# Patient Record
Sex: Male | Born: 1935 | Race: White | Hispanic: No | Marital: Married | Smoking: Former smoker
Health system: Southern US, Community
[De-identification: ages and names within clinical notes are randomized; demographics above are authoritative.]

## PROBLEM LIST (undated history)

## (undated) DIAGNOSIS — G250 Essential tremor: Principal | ICD-10-CM

## (undated) DIAGNOSIS — I10 Essential (primary) hypertension: Secondary | ICD-10-CM

## (undated) HISTORY — DX: Essential tremor: G25.0

## (undated) HISTORY — PX: TUMOR REMOVAL: SHX12

---

## 2000-11-29 ENCOUNTER — Ambulatory Visit (HOSPITAL_COMMUNITY): Admission: RE | Admit: 2000-11-29 | Discharge: 2000-11-29 | Payer: Self-pay | Admitting: *Deleted

## 2006-05-24 ENCOUNTER — Ambulatory Visit (HOSPITAL_BASED_OUTPATIENT_CLINIC_OR_DEPARTMENT_OTHER): Admission: RE | Admit: 2006-05-24 | Discharge: 2006-05-25 | Payer: Self-pay | Admitting: Otolaryngology

## 2006-05-24 ENCOUNTER — Encounter (INDEPENDENT_AMBULATORY_CARE_PROVIDER_SITE_OTHER): Payer: Self-pay | Admitting: Specialist

## 2006-11-14 ENCOUNTER — Emergency Department (HOSPITAL_COMMUNITY): Admission: EM | Admit: 2006-11-14 | Discharge: 2006-11-14 | Payer: Self-pay | Admitting: Emergency Medicine

## 2006-12-19 ENCOUNTER — Encounter (INDEPENDENT_AMBULATORY_CARE_PROVIDER_SITE_OTHER): Payer: Self-pay | Admitting: Specialist

## 2006-12-19 ENCOUNTER — Ambulatory Visit (HOSPITAL_COMMUNITY): Admission: RE | Admit: 2006-12-19 | Discharge: 2006-12-19 | Payer: Self-pay | Admitting: *Deleted

## 2007-05-08 ENCOUNTER — Observation Stay (HOSPITAL_COMMUNITY): Admission: EM | Admit: 2007-05-08 | Discharge: 2007-05-09 | Payer: Self-pay | Admitting: Emergency Medicine

## 2008-09-09 ENCOUNTER — Ambulatory Visit: Payer: Self-pay | Admitting: Oncology

## 2011-03-09 NOTE — H&P (Signed)
NAME:  Andres Lopez, Andres Lopez NO.:  [id number]   MEDICAL RECORD NO.:  [id number]          PATIENT TYPE:  EMS   LOCATION:  MAJO                         FACILITY:  [facility]   PHYSICIAN:  Ladell Pier, M.D.   DATE OF BIRTH:  May 30, 1936   DATE OF ADMISSION:  05/07/2007  DATE OF DISCHARGE:                              HISTORY & PHYSICAL   PRIMARY CARE PHYSICIAN:  Dr. Merri Brunette.   CHIEF COMPLAINT:  Nausea, vomiting and diarrhea.   HISTORY OF PRESENT ILLNESS:  Patient is a 75 year old white male, past  medical history significant for hypertension and dyslipidemia.  Patient  stated that after eating a salad last night with tomatoes, approximately  1 hour after that, he developed nausea, vomiting, diarrhea and abdominal  pain.  He also had low grade fevers.  He has had this similar problem in  the past.  In February, he had both upper and lower endoscopy done for  guaiac positive stools by Dr. Virginia Rochester that were both normal, except for  diverticulosis.   PAST MEDICAL HISTORY:  Significant for:  1. Hypertension.  2. Dyslipidemia.  3. 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  4. History of diverticulosis seen on colonoscopy.   FAMILY HISTORY:  Noncontributory.   SOCIAL HISTORY:  He is married.  He has 1 child.  He lives in  [place].  He does not smoke or drink alcohol.  He is employed with  [employer].  He is a Re[employer] [place].   MEDICATIONS:  He takes:  1. Calan 340 mg daily.  2. Lipitor 20 mg q.h.s.  3. Aspirin 81 mg daily.  4. __________ 5 mg daily.   ALLERGIES:  NONE.   REVIEW OF SYSTEMS:  As per stated in the HPI.   PHYSICAL EXAMINATION:  VITAL SIGNS:  Temperature 99.9.  Blood pressure  146/77.  Pulse of 82.  Respirations 20.  Pulse oximetry 97% on room air.  HEENT:  Head is normocephalic, atraumatic.  Pupils equal and reactive to  light without erythema.  CARDIOVASCULAR:  Regular rate and rhythm.  LUNGS:  Clear bilaterally.  ABDOMEN:   Soft, diffusely tender, positive bowel sounds.  EXTREMITIES:  Without edema.   LABORATORY DATA:  CAT scan of the abdomen shows enteritis.  Sodium 139,  potassium 3.6, chloride 106, CO2 25.9, BUN 12, creatinine 0.9, glucose  99.  WBC 20.1, hemoglobin 13.1, platelets 292, MCV 86.8, lipase 16, AST  19, ALT 18.  UA negative.   ASSESSMENT/PLAN:  1. Acute gastroenteritis.  We will admit him to the hospital,      observation, give him IV fluids, check stool cultures.  We will      also start him on Cipro 250 mg b.i.d.  If he remains stable without      any worsening any of his symptoms, then we will discharge home.  2. Hypertension.  We will continue him on his home medication.  3. Dyslipidemia.  We will also continue him on his Lipitor.      Ladell Pier, M.D.  Electronically Signed     NJ/MEDQ  D:  05/08/2007  T:  05/08/2007  Job:  [id number]

## 2011-03-09 NOTE — Discharge Summary (Signed)
NAME:  Andres Lopez, Andres Lopez NO.:  [id number]   MEDICAL RECORD NO.:  [id number]          PATIENT TYPE:  INP   LOCATION:  5531                         FACILITY:  [facility]   PHYSICIAN:  Ladell Pier, M.D.   DATE OF BIRTH:  14-Aug-1936   DATE OF ADMISSION:  05/07/2007  DATE OF DISCHARGE:  05/09/2007                               DISCHARGE SUMMARY   DISCHARGE DIAGNOSES:  1. Acute gastroenteritis with nausea, vomiting, and diarrhea.  2. Hypertension.  3. Dyslipidemia.  4. History of diverticulosis seen on colonoscopy.  5. Benign tumor removed from the neck recently.   DISCHARGE MEDICATIONS:  1. Cipro 250 mg b.i.d.  2. Kaolin x3 days.  3. Kaolin 240 mg daily.  4. Lipitor 20 mg q.h.s.  5. Aspirin 81 mg daily.  6. Altace 5 mg daily.   FOLLOW UP APPOINTMENTS:  The patient is to follow up with primary care  physician Dr. Renne Crigler.   CONSULTANTS:  None.   PROCEDURES:  None.   HISTORY OF PRESENT ILLNESS:  The patient is a 76 year old white male  with a past medical history significant for hypertension, dyslipidemia.  The patient stated that after eating a salad last night with tomatoes,  that approximately 1 hour after that he became ill with nausea,  vomiting, and diarrhea and abdominal pain.  He had low-grade fevers.  Past medical history, family and social history, meds, allergies, review  of systems, per the admission H and P.   PHYSICAL EXAMINATION ON DISCHARGE:  VITAL SIGNS:  Temperature 98.8,  pulse 66, respirations 20, blood pressure 160/83, pulse ox 93% on room  air.  HEENT:  Normocephalic, atraumatic.  Pupils reactive to light.  Throat  without erythema.  CARDIOVASCULAR:  Regular rate and rhythm.  LUNGS:  Clear bilaterally.  ABDOMEN:  Soft, nontender, nondistended.  Positive bowel sounds.  EXTREMITIES:  Without edema.   HOSPITAL COURSE:  1. Acute gastroenteritis with nausea, vomiting, and diarrhea:  The      patient was admitted to the hospital and given IV  fluids, Phenergan      p.r.n. for nausea, and started on Cipro.  The second day of      admission, he felt great.  No more abdominal pain, no episodes of      nausea or vomiting.  He was discharged on Cipro for 3 days and told      to follow up with PCP.  He was given Cipro because his stool      culture was positive for fecal lactoferrin, which is increased      white blood cells.  2. Hypertension:  He was continued on his Altace while he was in the      hospital.  His blood pressure was mildly elevated probably      secondary to the fluid he was receiving from IV fluids.  He will go      back to his normal blood pressure regimen on discharge.  3. Dyslipidemia:  He will be discharged on his Lipitor.   DISCHARGE LABS:  Fecal lactoferrin positive.  BMP:  Sodium 136,  potassium 4, chloride  104, CO2 24, glucose 87, BUN 11, creatinine 0.8.  WBC 13, hemoglobin 12.2, platelets 262,000.  CT of the abdomen and  pelvis mild dilated proximal to marginal small bowel loop but no  evidence of obstruction, some question of mild inflammatory enteritis,  large duodenal diverticulum, the sigmoid colon and prostate gland  enlarged.      Ladell Pier, M.D.  Electronically Signed     NJ/MEDQ  D:  05/09/2007  T:  05/10/2007  Job:  [id number]   cc:   Soyla Murphy. Renne Crigler, M.D.

## 2011-03-12 NOTE — Procedures (Signed)
[facility]  Patient:    Andres Lopez, Andres Lopez                      MRN: [id number] Proc. Date: 11/29/00 Adm. Date:  [id number] Attending:  Sabino Gasser                           Procedure Report  PROCEDURE PERFORMED:  Colonoscopy.  ENDOSCOPIST:  Sabino Gasser, M.D.  INDICATIONS FOR PROCEDURE:  Rectal bleeding.  ANESTHESIA:  Demerol 90 mg, Versed 9 mg.  DESCRIPTION OF PROCEDURE:  With the patient mildly sedated in the left lateral decubitus position, the Olympus videoscopic colonoscope was inserted in the rectum after normal rectal examination was performed and passed under direct vision into the cecum.  The cecum was identified by the ileocecal valve and appendiceal orifice, both of which were photographed.  From this point, the colonoscope was slowly withdrawn, taking circumferential views of the entire colonic mucosa stopping in the sigmoid colon area where there was significant diverticulosis seen and some tortuosity.  Photograph was taken.  The endoscope was pulled to the rectum, which appeared normal on direct and retroflex view as well and pulled through the anal canal.  Patients vital signs and pulse oximeter remained stable.  The patient tolerated the procedure well and without apparent complications.  FINDINGS:  Diverticulosis of the sigmoid colon. Otherwise unremarkable colonoscopic examination to the cecum.  PLAN:  Repeat examination in five years. DD:  11/29/00 TD:  11/30/00 Job: [id number]

## 2011-03-12 NOTE — Op Note (Signed)
NAME:  Andres Lopez, BENTLER NO.:  [id number]   MEDICAL RECORD NO.:  [id number]          PATIENT TYPE:  AMB   LOCATION:  ENDO                         FACILITY:  [facility]   PHYSICIAN:  Georgiana Spinner, M.D.    DATE OF BIRTH:  05-12-1936   DATE OF PROCEDURE:  12/19/2006  DATE OF DISCHARGE:                               OPERATIVE REPORT   PROCEDURE:  Colonoscopy.   INDICATIONS:  Hemoccult positivity.   ANESTHESIA:  Fentanyl 25 mcg, Versed 2 mg.   PROCEDURE:  With the patient mildly sedated in the left lateral  decubitus position, a rectal examination was performed which was  unremarkable to my exam.  Subsequently, the Pentax videoscopic  colonoscope was inserted into the rectum and passed under direct vision  to the cecum identified by ileocecal valve and appendiceal orifice both  of which were photographed.  From this point, the colonoscope was slowly  withdrawn, taking circumferential views of colonic mucosa, stopping to  photograph diverticulosis noted in the sigmoid colon until we reached  the rectum which appeared normal on direct and showed hemorrhoids on  retroflexed view.  The endoscope was straightened and withdrawn.  The  patient's vital signs and pulse oximeter remained stable.  The patient  tolerated the procedure well without apparent complications.   FINDINGS:  Diverticulosis of sigmoid colon, internal hemorrhoids,  otherwise unremarkable examination.   PLAN:  See endoscopy note for further details.           ______________________________  Georgiana Spinner, M.D.     GMO/MEDQ  D:  12/19/2006  T:  12/19/2006  Job:  [id number]

## 2011-08-08 ENCOUNTER — Emergency Department (HOSPITAL_COMMUNITY)
Admission: EM | Admit: 2011-08-08 | Discharge: 2011-08-09 | Disposition: A | Discharge status: Home / Self Care | Payer: No Typology Code available for payment source | Attending: Emergency Medicine | Admitting: Emergency Medicine

## 2011-08-08 ENCOUNTER — Emergency Department (HOSPITAL_COMMUNITY): Payer: No Typology Code available for payment source

## 2011-08-08 DIAGNOSIS — M25569 Pain in unspecified knee: Secondary | ICD-10-CM | POA: Insufficient documentation

## 2011-08-08 DIAGNOSIS — S99929A Unspecified injury of unspecified foot, initial encounter: Secondary | ICD-10-CM | POA: Insufficient documentation

## 2011-08-08 DIAGNOSIS — S301XXA Contusion of abdominal wall, initial encounter: Secondary | ICD-10-CM | POA: Insufficient documentation

## 2011-08-08 DIAGNOSIS — S8990XA Unspecified injury of unspecified lower leg, initial encounter: Secondary | ICD-10-CM | POA: Insufficient documentation

## 2011-08-08 DIAGNOSIS — Z7982 Long term (current) use of aspirin: Secondary | ICD-10-CM | POA: Insufficient documentation

## 2011-08-08 DIAGNOSIS — R109 Unspecified abdominal pain: Secondary | ICD-10-CM | POA: Insufficient documentation

## 2011-08-08 DIAGNOSIS — Z79899 Other long term (current) drug therapy: Secondary | ICD-10-CM | POA: Insufficient documentation

## 2011-08-08 DIAGNOSIS — M25469 Effusion, unspecified knee: Secondary | ICD-10-CM | POA: Insufficient documentation

## 2011-08-08 DIAGNOSIS — IMO0002 Reserved for concepts with insufficient information to code with codable children: Secondary | ICD-10-CM | POA: Insufficient documentation

## 2011-08-08 DIAGNOSIS — S51009A Unspecified open wound of unspecified elbow, initial encounter: Secondary | ICD-10-CM | POA: Insufficient documentation

## 2011-08-08 DIAGNOSIS — I10 Essential (primary) hypertension: Secondary | ICD-10-CM | POA: Insufficient documentation

## 2011-08-08 DIAGNOSIS — M25559 Pain in unspecified hip: Secondary | ICD-10-CM | POA: Insufficient documentation

## 2011-08-09 LAB — BASIC METABOLIC PANEL
CO2: 24
Calcium: 8.4
Chloride: 104
GFR calc Af Amer: >60
Glucose, Bld: 87
Potassium: 4
Sodium: 136

## 2011-08-09 LAB — CBC
HCT: 35.8 — ABNORMAL LOW
Hemoglobin: 12.2 — ABNORMAL LOW
MCHC: 34.2
MCV: 87.2
RBC: 4.1 — ABNORMAL LOW

## 2011-08-09 LAB — CLOSTRIDIUM DIFFICILE EIA

## 2011-08-10 LAB — GIARDIA/CRYPTOSPORIDIUM SCREEN(EIA)
Cryptosporidium Screen (EIA): NEGATIVE
Giardia Screen - EIA: NEGATIVE

## 2011-08-10 LAB — URINALYSIS, ROUTINE W REFLEX MICROSCOPIC
Glucose, UA: NEGATIVE
Protein, ur: NEGATIVE
Specific Gravity, Urine: 1.026

## 2011-08-10 LAB — FECAL LACTOFERRIN, QUANT

## 2011-08-10 LAB — CLOSTRIDIUM DIFFICILE EIA

## 2011-08-10 LAB — CBC
HCT: 39.3
Platelets: 292
RBC: 4.53
WBC: 20.1 — ABNORMAL HIGH

## 2011-08-10 LAB — I-STAT 8, (EC8 V) (CONVERTED LAB)
BUN: 12
Chloride: 106
HCT: 42
Hemoglobin: 14.3
Operator id: 277751
Potassium: 3.6
Sodium: 139

## 2011-08-10 LAB — DIFFERENTIAL
Eosinophils Relative: 0
Lymphocytes Relative: 7 — ABNORMAL LOW
Lymphs Abs: 1.5
Neutrophils Relative %: 86 — ABNORMAL HIGH

## 2011-08-10 LAB — HEPATIC FUNCTION PANEL
ALT: 18
Albumin: 3.6
Alkaline Phosphatase: 93
Total Protein: 6.8

## 2011-11-08 DIAGNOSIS — N138 Other obstructive and reflux uropathy: Secondary | ICD-10-CM | POA: Diagnosis not present

## 2011-11-08 DIAGNOSIS — N401 Enlarged prostate with lower urinary tract symptoms: Secondary | ICD-10-CM | POA: Diagnosis not present

## 2011-11-08 DIAGNOSIS — R972 Elevated prostate specific antigen [PSA]: Secondary | ICD-10-CM | POA: Diagnosis not present

## 2011-11-10 DIAGNOSIS — R0602 Shortness of breath: Secondary | ICD-10-CM | POA: Diagnosis not present

## 2011-11-12 DIAGNOSIS — Z79899 Other long term (current) drug therapy: Secondary | ICD-10-CM | POA: Diagnosis not present

## 2011-11-12 DIAGNOSIS — E7800 Pure hypercholesterolemia, unspecified: Secondary | ICD-10-CM | POA: Diagnosis not present

## 2011-11-15 DIAGNOSIS — M76899 Other specified enthesopathies of unspecified lower limb, excluding foot: Secondary | ICD-10-CM | POA: Diagnosis not present

## 2011-11-15 DIAGNOSIS — N401 Enlarged prostate with lower urinary tract symptoms: Secondary | ICD-10-CM | POA: Diagnosis not present

## 2011-11-15 DIAGNOSIS — R972 Elevated prostate specific antigen [PSA]: Secondary | ICD-10-CM | POA: Diagnosis not present

## 2011-12-01 DIAGNOSIS — G252 Other specified forms of tremor: Secondary | ICD-10-CM | POA: Diagnosis not present

## 2011-12-01 DIAGNOSIS — G250 Essential tremor: Secondary | ICD-10-CM | POA: Diagnosis not present

## 2012-02-28 DIAGNOSIS — G250 Essential tremor: Secondary | ICD-10-CM | POA: Diagnosis not present

## 2012-02-28 DIAGNOSIS — G252 Other specified forms of tremor: Secondary | ICD-10-CM | POA: Diagnosis not present

## 2012-03-29 DIAGNOSIS — G250 Essential tremor: Secondary | ICD-10-CM | POA: Diagnosis not present

## 2012-03-29 DIAGNOSIS — G252 Other specified forms of tremor: Secondary | ICD-10-CM | POA: Diagnosis not present

## 2012-04-20 DIAGNOSIS — E7800 Pure hypercholesterolemia, unspecified: Secondary | ICD-10-CM | POA: Diagnosis not present

## 2012-04-20 DIAGNOSIS — Z79899 Other long term (current) drug therapy: Secondary | ICD-10-CM | POA: Diagnosis not present

## 2012-04-25 DIAGNOSIS — E7800 Pure hypercholesterolemia, unspecified: Secondary | ICD-10-CM | POA: Diagnosis not present

## 2012-04-25 DIAGNOSIS — Z79899 Other long term (current) drug therapy: Secondary | ICD-10-CM | POA: Diagnosis not present

## 2012-04-25 DIAGNOSIS — I10 Essential (primary) hypertension: Secondary | ICD-10-CM | POA: Diagnosis not present

## 2012-05-05 DIAGNOSIS — Z0000 Encounter for general adult medical examination without abnormal findings: Secondary | ICD-10-CM | POA: Diagnosis not present

## 2012-05-05 DIAGNOSIS — N401 Enlarged prostate with lower urinary tract symptoms: Secondary | ICD-10-CM | POA: Diagnosis not present

## 2012-05-12 DIAGNOSIS — R972 Elevated prostate specific antigen [PSA]: Secondary | ICD-10-CM | POA: Diagnosis not present

## 2012-05-12 DIAGNOSIS — N401 Enlarged prostate with lower urinary tract symptoms: Secondary | ICD-10-CM | POA: Diagnosis not present

## 2012-07-06 DIAGNOSIS — M25559 Pain in unspecified hip: Secondary | ICD-10-CM | POA: Diagnosis not present

## 2012-07-06 DIAGNOSIS — M76899 Other specified enthesopathies of unspecified lower limb, excluding foot: Secondary | ICD-10-CM | POA: Diagnosis not present

## 2012-08-02 DIAGNOSIS — Z23 Encounter for immunization: Secondary | ICD-10-CM | POA: Diagnosis not present

## 2012-09-29 DIAGNOSIS — G250 Essential tremor: Secondary | ICD-10-CM | POA: Diagnosis not present

## 2012-09-29 DIAGNOSIS — G252 Other specified forms of tremor: Secondary | ICD-10-CM | POA: Diagnosis not present

## 2012-10-16 DIAGNOSIS — E7800 Pure hypercholesterolemia, unspecified: Secondary | ICD-10-CM | POA: Diagnosis not present

## 2012-10-16 DIAGNOSIS — I10 Essential (primary) hypertension: Secondary | ICD-10-CM | POA: Diagnosis not present

## 2012-10-16 DIAGNOSIS — Z0000 Encounter for general adult medical examination without abnormal findings: Secondary | ICD-10-CM | POA: Diagnosis not present

## 2012-10-16 DIAGNOSIS — Z79899 Other long term (current) drug therapy: Secondary | ICD-10-CM | POA: Diagnosis not present

## 2012-10-23 DIAGNOSIS — N400 Enlarged prostate without lower urinary tract symptoms: Secondary | ICD-10-CM | POA: Diagnosis not present

## 2012-10-23 DIAGNOSIS — I10 Essential (primary) hypertension: Secondary | ICD-10-CM | POA: Diagnosis not present

## 2012-10-23 DIAGNOSIS — R259 Unspecified abnormal involuntary movements: Secondary | ICD-10-CM | POA: Diagnosis not present

## 2012-10-23 DIAGNOSIS — E7800 Pure hypercholesterolemia, unspecified: Secondary | ICD-10-CM | POA: Diagnosis not present

## 2012-10-31 DIAGNOSIS — D638 Anemia in other chronic diseases classified elsewhere: Secondary | ICD-10-CM | POA: Diagnosis not present

## 2012-11-09 DIAGNOSIS — D500 Iron deficiency anemia secondary to blood loss (chronic): Secondary | ICD-10-CM | POA: Diagnosis not present

## 2013-01-29 ENCOUNTER — Other Ambulatory Visit: Payer: Self-pay | Admitting: Gastroenterology

## 2013-01-29 DIAGNOSIS — D1330 Benign neoplasm of unspecified part of small intestine: Secondary | ICD-10-CM | POA: Diagnosis not present

## 2013-01-29 DIAGNOSIS — D126 Benign neoplasm of colon, unspecified: Secondary | ICD-10-CM | POA: Diagnosis not present

## 2013-01-29 DIAGNOSIS — K648 Other hemorrhoids: Secondary | ICD-10-CM | POA: Diagnosis not present

## 2013-01-29 DIAGNOSIS — K5730 Diverticulosis of large intestine without perforation or abscess without bleeding: Secondary | ICD-10-CM | POA: Diagnosis not present

## 2013-01-29 DIAGNOSIS — K5710 Diverticulosis of small intestine without perforation or abscess without bleeding: Secondary | ICD-10-CM | POA: Diagnosis not present

## 2013-01-29 DIAGNOSIS — K5289 Other specified noninfective gastroenteritis and colitis: Secondary | ICD-10-CM | POA: Diagnosis not present

## 2013-01-29 DIAGNOSIS — D509 Iron deficiency anemia, unspecified: Secondary | ICD-10-CM | POA: Diagnosis not present

## 2013-02-03 ENCOUNTER — Emergency Department (INDEPENDENT_AMBULATORY_CARE_PROVIDER_SITE_OTHER)
Admission: EM | Admit: 2013-02-03 | Discharge: 2013-02-03 | Disposition: A | Discharge status: Home / Self Care | Payer: Medicare Other | Source: Home / Self Care | Attending: Family Medicine | Admitting: Family Medicine

## 2013-02-03 ENCOUNTER — Encounter (HOSPITAL_COMMUNITY): Payer: Self-pay | Admitting: Emergency Medicine

## 2013-02-03 DIAGNOSIS — R066 Hiccough: Secondary | ICD-10-CM | POA: Diagnosis not present

## 2013-02-03 HISTORY — DX: Essential (primary) hypertension: I10

## 2013-02-03 MED ORDER — CHLORPROMAZINE HCL 25 MG PO TABS: 25.0000 mg | ORAL_TABLET | Freq: Three times a day (TID) | ORAL | Status: DC

## 2013-02-03 NOTE — ED Provider Notes (Signed)
History     CSN: [id number]  Arrival date & time 02/03/13  1458   First MD Initiated Contact with Patient 02/03/13 1516      Chief Complaint  Patient presents with  . Shortness of Breath    since monday after having endoscopy done. started with hiccups first and then progressed into sob. denies chest pain.    HPI: The history is provided by the patient and the spouse.  Pt and spouse report that patient had an endoscopy on Monday of this week. Since that time he has had sinus congestion, throat irritated and persistent hiccups. Called PCP Thursday and was prescribed a nasal spray for his sinus congestion and Tessalon Perles for his throat irritation. However, patient states the hiccups have persisted. Wife reports that patient has hiccups even when sleeping. Nothing seems to make the hiccups better or worse. Wife admits that in their paperwork hiccups was listed as a possible side effect of the endoscopy. But now it is interfering with the patient's ability to sleep.  No past medical history on file.  No past surgical history on file.  No family history on file.  History  Substance Use Topics  . Smoking status: Not on file  . Smokeless tobacco: Not on file  . Alcohol Use: Not on file      Review of Systems  Constitutional: Negative for fever and chills.  HENT: Positive for congestion and postnasal drip. Negative for ear pain, nosebleeds, sore throat, rhinorrhea, sneezing, trouble swallowing, neck pain, sinus pressure and tinnitus.   Eyes: Negative.   Respiratory: Negative.   Cardiovascular: Negative.   Gastrointestinal: Negative.   Endocrine: Negative.   Genitourinary: Negative.   Allergic/Immunologic: Negative.   Neurological: Negative.   Hematological: Negative.   Psychiatric/Behavioral: Negative.     Allergies  Review of patient's allergies indicates no known allergies.  Home Medications   Current Outpatient Rx  Name  Route  Sig  Dispense  Refill  . LOSARTAN  POTASSIUM PO   Oral   Take by mouth.         . propranolol (INDERAL) 80 MG tablet   Oral   Take 80 mg by mouth 3 (three) times daily.         Marland Kitchen VERAPAMIL HCL ER, CO, PO   Oral   Take by mouth.           BP 154/65  Pulse 56  Temp(Src) 98.1 F (36.7 C) (Oral)  SpO2 98%  Physical Exam  Constitutional: He is oriented to person, place, and time. He appears well-developed and well-nourished.  HENT:  Head: Normocephalic and atraumatic.  Right Ear: Tympanic membrane, external ear and ear canal normal.  Left Ear: Tympanic membrane, external ear and ear canal normal.  Nose: Nose normal.  Mouth/Throat: Uvula is midline, oropharynx is clear and moist and mucous membranes are normal.  Neck: Neck supple.  Cardiovascular: Normal rate and regular rhythm.   Pulmonary/Chest: Effort normal and breath sounds normal.  Patient noted with persistent hiccups throughout exam.  Musculoskeletal: Normal range of motion.  Neurological: He is alert and oriented to person, place, and time.  Skin: Skin is warm and dry.  Psychiatric: He has a normal mood and affect.    ED Course  Procedures (including critical care time)  Labs Reviewed - No data to display No results found.   No diagnosis found.    MDM  Patient complains of sinus congestion, throat irritation and persistent hiccups since endoscopy on Monday of  this week. PCP has prescribed medication for sinus congestion and throat irritation. PE unremarkable except for a persistent hiccups. Will prescribe Thorazine for hiccups and encourage followup with PCP if hiccups persist. Patient wife agreeable with plan.        Leanne Chang, NP 02/03/13 1616

## 2013-02-03 NOTE — ED Notes (Signed)
Pt c/o sob since Monday evening after having endoscopy done. Pt states that it started with hiccups and the sob off/on.   Nasal congestion and sore throat.  Denies fever, n/v/d

## 2013-02-05 NOTE — ED Provider Notes (Signed)
Medical screening examination/treatment/procedure(s) were performed by resident physician or non-physician practitioner and as supervising physician I was immediately available for consultation/collaboration.   Barkley Bruns MD.   Linna Hoff, MD 02/05/13 [phone]

## 2013-04-02 DIAGNOSIS — D126 Benign neoplasm of colon, unspecified: Secondary | ICD-10-CM | POA: Diagnosis not present

## 2013-04-02 DIAGNOSIS — K5730 Diverticulosis of large intestine without perforation or abscess without bleeding: Secondary | ICD-10-CM | POA: Diagnosis not present

## 2013-04-02 DIAGNOSIS — Z86010 Personal history of colonic polyps: Secondary | ICD-10-CM | POA: Diagnosis not present

## 2013-04-05 DIAGNOSIS — G250 Essential tremor: Secondary | ICD-10-CM | POA: Diagnosis not present

## 2013-05-08 DIAGNOSIS — R972 Elevated prostate specific antigen [PSA]: Secondary | ICD-10-CM | POA: Diagnosis not present

## 2013-05-15 DIAGNOSIS — R972 Elevated prostate specific antigen [PSA]: Secondary | ICD-10-CM | POA: Diagnosis not present

## 2013-05-15 DIAGNOSIS — N401 Enlarged prostate with lower urinary tract symptoms: Secondary | ICD-10-CM | POA: Diagnosis not present

## 2013-06-12 DIAGNOSIS — R972 Elevated prostate specific antigen [PSA]: Secondary | ICD-10-CM | POA: Diagnosis not present

## 2013-07-30 DIAGNOSIS — M76899 Other specified enthesopathies of unspecified lower limb, excluding foot: Secondary | ICD-10-CM | POA: Diagnosis not present

## 2013-07-30 DIAGNOSIS — M25559 Pain in unspecified hip: Secondary | ICD-10-CM | POA: Diagnosis not present

## 2013-09-13 DIAGNOSIS — R972 Elevated prostate specific antigen [PSA]: Secondary | ICD-10-CM | POA: Diagnosis not present

## 2013-09-14 DIAGNOSIS — R066 Hiccough: Secondary | ICD-10-CM | POA: Diagnosis not present

## 2013-09-20 ENCOUNTER — Inpatient Hospital Stay (HOSPITAL_COMMUNITY)
Admission: EM | Admit: 2013-09-20 | Discharge: 2013-09-22 | DRG: 683 | Disposition: A | Discharge status: Home / Self Care | Payer: Medicare Other | Attending: Internal Medicine | Admitting: Internal Medicine

## 2013-09-20 ENCOUNTER — Encounter (HOSPITAL_COMMUNITY): Payer: Self-pay | Admitting: Emergency Medicine

## 2013-09-20 ENCOUNTER — Emergency Department (HOSPITAL_COMMUNITY): Payer: Medicare Other

## 2013-09-20 DIAGNOSIS — E871 Hypo-osmolality and hyponatremia: Secondary | ICD-10-CM | POA: Diagnosis present

## 2013-09-20 DIAGNOSIS — Z7982 Long term (current) use of aspirin: Secondary | ICD-10-CM

## 2013-09-20 DIAGNOSIS — I10 Essential (primary) hypertension: Secondary | ICD-10-CM | POA: Diagnosis present

## 2013-09-20 DIAGNOSIS — Z79899 Other long term (current) drug therapy: Secondary | ICD-10-CM | POA: Diagnosis not present

## 2013-09-20 DIAGNOSIS — E860 Dehydration: Secondary | ICD-10-CM | POA: Diagnosis not present

## 2013-09-20 DIAGNOSIS — R066 Hiccough: Secondary | ICD-10-CM | POA: Diagnosis not present

## 2013-09-20 DIAGNOSIS — D649 Anemia, unspecified: Secondary | ICD-10-CM | POA: Diagnosis not present

## 2013-09-20 DIAGNOSIS — D72829 Elevated white blood cell count, unspecified: Secondary | ICD-10-CM | POA: Diagnosis not present

## 2013-09-20 DIAGNOSIS — R0602 Shortness of breath: Secondary | ICD-10-CM | POA: Diagnosis not present

## 2013-09-20 DIAGNOSIS — N179 Acute kidney failure, unspecified: Secondary | ICD-10-CM | POA: Diagnosis not present

## 2013-09-20 DIAGNOSIS — E876 Hypokalemia: Secondary | ICD-10-CM | POA: Diagnosis not present

## 2013-09-20 DIAGNOSIS — Z87891 Personal history of nicotine dependence: Secondary | ICD-10-CM

## 2013-09-20 LAB — CBC WITH DIFFERENTIAL/PLATELET
Basophils Absolute: 0 10*3/uL (ref 0.0–0.1)
Hemoglobin: 9.8 g/dL — ABNORMAL LOW (ref 13.0–17.0)
Lymphocytes Relative: 18 % (ref 12–46)
Lymphs Abs: 2.4 10*3/uL (ref 0.7–4.0)
MCV: 82.6 fL (ref 78.0–100.0)
Monocytes Relative: 10 % (ref 3–12)
Neutrophils Relative %: 71 % (ref 43–77)
Platelets: 237 10*3/uL (ref 150–400)
RBC: 3.34 MIL/uL — ABNORMAL LOW (ref 4.22–5.81)
RDW: 15.6 % — ABNORMAL HIGH (ref 11.5–15.5)
WBC: 13 10*3/uL — ABNORMAL HIGH (ref 4.0–10.5)

## 2013-09-20 LAB — BASIC METABOLIC PANEL
CO2: 24 mEq/L (ref 19–32)
Creatinine, Ser: 1.69 mg/dL — ABNORMAL HIGH (ref 0.50–1.35)
GFR calc non Af Amer: 38 mL/min — ABNORMAL LOW (ref >90)
Glucose, Bld: 97 mg/dL (ref 70–99)
Potassium: 2.8 mEq/L — ABNORMAL LOW (ref 3.5–5.1)
Sodium: 132 mEq/L — ABNORMAL LOW (ref 135–145)

## 2013-09-20 LAB — TROPONIN I: Troponin I: <0.3 ng/mL (ref <0.30)

## 2013-09-20 MED ORDER — ONDANSETRON HCL 4 MG/2ML IJ SOLN: 4.0000 mg | Freq: Four times a day (QID) | INTRAMUSCULAR | Status: DC | PRN

## 2013-09-20 MED ORDER — SODIUM CHLORIDE 0.9 % IJ SOLN
3.0000 mL | Freq: Two times a day (BID) | INTRAMUSCULAR | Status: DC
  Administered 2013-09-21 – 2013-09-22 (×2): 3 mL via INTRAVENOUS

## 2013-09-20 MED ORDER — DOCUSATE SODIUM 100 MG PO CAPS
100.0000 mg | ORAL_CAPSULE | Freq: Two times a day (BID) | ORAL | Status: DC
  Administered 2013-09-21: 100 mg via ORAL
  Filled 2013-09-20 (×4): qty 1

## 2013-09-20 MED ORDER — ONDANSETRON HCL 4 MG PO TABS: 4.0000 mg | ORAL_TABLET | Freq: Four times a day (QID) | ORAL | Status: DC | PRN

## 2013-09-20 MED ORDER — POTASSIUM CHLORIDE CRYS ER 20 MEQ PO TBCR
40.0000 meq | EXTENDED_RELEASE_TABLET | Freq: Once | ORAL | Status: AC
  Administered 2013-09-20: 40 meq via ORAL
  Filled 2013-09-20: qty 2

## 2013-09-20 MED ORDER — SODIUM CHLORIDE 0.9 % IV SOLN: INTRAVENOUS | Status: DC

## 2013-09-20 MED ORDER — POTASSIUM CHLORIDE CRYS ER 20 MEQ PO TBCR
40.0000 meq | EXTENDED_RELEASE_TABLET | Freq: Every day | ORAL | Status: DC
  Filled 2013-09-20: qty 2

## 2013-09-20 MED ORDER — POTASSIUM CHLORIDE 10 MEQ/100ML IV SOLN
10.0000 meq | INTRAVENOUS | Status: AC
  Administered 2013-09-21 (×3): 10 meq via INTRAVENOUS
  Filled 2013-09-20 (×3): qty 100

## 2013-09-20 MED ORDER — PROPRANOLOL HCL ER 120 MG PO CP24
120.0000 mg | ORAL_CAPSULE | Freq: Every day | ORAL | Status: DC
  Administered 2013-09-21 – 2013-09-22 (×2): 120 mg via ORAL
  Filled 2013-09-20 (×2): qty 1

## 2013-09-20 MED ORDER — LOSARTAN POTASSIUM 50 MG PO TABS
50.0000 mg | ORAL_TABLET | Freq: Every day | ORAL | Status: DC
  Filled 2013-09-20: qty 1

## 2013-09-20 MED ORDER — ATORVASTATIN CALCIUM 80 MG PO TABS
80.0000 mg | ORAL_TABLET | Freq: Every day | ORAL | Status: DC
  Administered 2013-09-21: 80 mg via ORAL
  Filled 2013-09-20 (×2): qty 1

## 2013-09-20 MED ORDER — KCL IN DEXTROSE-NACL 40-5-0.9 MEQ/L-%-% IV SOLN
INTRAVENOUS | Status: DC
  Administered 2013-09-21: 01:00:00 via INTRAVENOUS
  Filled 2013-09-20 (×3): qty 1000

## 2013-09-20 MED ORDER — SODIUM CHLORIDE 0.9 % IV BOLUS (SEPSIS)
1000.0000 mL | Freq: Once | INTRAVENOUS | Status: AC
  Administered 2013-09-20: 1000 mL via INTRAVENOUS

## 2013-09-20 NOTE — ED Notes (Signed)
Pt st's he has had hiccups x's 1 week.  Also c/o shortness of breath x's 3 days.  Pt denies any chest pain

## 2013-09-20 NOTE — H&P (Signed)
[facility] History and Physical  Nesbit Michon NFA:[id number] DOB: 1936-06-07    PCP:   Londell Moh, MD   Chief Complaint: hiccups and diarrhea.  HPI: Andres Lopez is an 77 y.o. male with hx of HTN on Losartan HCT, along with Verelan and Betablocker, hx of prolonged hiccups with last episode in 01/2013, presents to the ER with hiccups not relieved with Thorazine, and having mild shortness of breath without chest pain, coughs, fever or chills.  He was evaluated and found to have K of 2.8 along with Cr of 1.6.  Unfortunately, his last Cr was normal, but it was done in 2008.  There was a paucity of medical information otherwise.  He has a clear chest xray and negative EKG.  He admitted to having diarrhea without abdominal pain fever or chills.  He had some nausea and vomiting.  He also found to have an anemia with Hb of 9.8 g/ dL, and there was no recent Hb to compare.  He denied black or bloody stool and has no definite evidence of bleeding.  He was given K, and hospitalist was asked to admit him for elevated Cr, hypokalemia, and anemia.  He has no ill contact, distant travel, and lives with his wife who is well.  Rewiew of Systems:  Constitutional: Negative for malaise, fever and chills. No significant weight loss or weight gain Eyes: Negative for eye pain, redness and discharge, diplopia, visual changes, or flashes of light. ENMT: Negative for ear pain, hoarseness, nasal congestion, sinus pressure and sore throat. No headaches; tinnitus, drooling, or problem swallowing. Cardiovascular: Negative for chest pain, palpitations, diaphoresis,  and peripheral edema. ; No orthopnea, PND Respiratory: Negative for cough, hemoptysis, wheezing and stridor. No pleuritic chestpain. Gastrointestinal: Negative for nausea, vomiting,  constipation, abdominal pain, melena, blood in stool, hematemesis, jaundice and rectal bleeding.    Genitourinary: Negative for frequency, dysuria,  incontinence,flank pain and hematuria; Musculoskeletal: Negative for back pain and neck pain. Negative for swelling and trauma.;  Skin: . Negative for pruritus, rash, abrasions, bruising and skin lesion.; ulcerations Neuro: Negative for headache, lightheadedness and neck stiffness. Negative for weakness, altered level of consciousness , altered mental status, extremity weakness, burning feet, involuntary movement, seizure and syncope.  Psych: negative for anxiety, depression, insomnia, tearfulness, panic attacks, hallucinations, paranoia, suicidal or homicidal ideation    Past Medical History  Diagnosis Date  . Hypertension     History reviewed. No pertinent past surgical history.  Medications:  HOME MEDS: Prior to Admission medications   Medication Sig Start Date End Date Taking? Authorizing Provider  aspirin EC 81 MG tablet Take 81 mg by mouth daily at 6 PM.   Yes Historical Provider, MD  atorvastatin (LIPITOR) 80 MG tablet Take 80 mg by mouth daily at 6 PM.   Yes Historical Provider, MD  chlorproMAZINE (THORAZINE) 25 MG tablet Take 25 mg by mouth 3 (three) times daily. 7 day course started 09/14/13 for hiccups 02/03/13  Yes Roma Kayser Schorr, NP  ibuprofen (ADVIL,MOTRIN) 200 MG tablet Take 400 mg by mouth 2 (two) times daily as needed (pain).   Yes Historical Provider, MD  losartan-hydrochlorothiazide (HYZAAR) 100-25 MG per tablet Take 1 tablet by mouth daily.   Yes Historical Provider, MD  propranolol ER (INDERAL LA) 120 MG 24 hr capsule Take 120 mg by mouth daily.   Yes Historical Provider, MD  verapamil (CALAN-SR) 240 MG CR tablet Take 240 mg by mouth daily at 6 PM.   Yes Historical Provider, MD  Allergies:  No Known Allergies  Social History:   reports that he has quit smoking. He does not have any smokeless tobacco history on file. He reports that he does not drink alcohol or use illicit drugs.  Family History: History reviewed. No pertinent family  history.   Physical Exam: Filed Vitals:   09/20/13 1944 09/20/13 1953 09/20/13 1957 09/20/13 2100  BP: 161/36  100/47 100/42  Pulse: 65  60 54  Temp: 97.8 F (36.6 C)     TempSrc: Oral     Resp: 18     SpO2: 98% 99% 99% 97%   Blood pressure 100/42, pulse 54, temperature 97.8 F (36.6 C), temperature source Oral, resp. rate 18, SpO2 97.00%.  GEN:  Pleasant patient lying in the stretcher in no acute distress; cooperative with exam. PSYCH:  alert and oriented x4; does not appear anxious or depressed; affect is appropriate. HEENT: Mucous membranes pink and anicteric; PERRLA; EOM intact; no cervical lymphadenopathy nor thyromegaly or carotid bruit; no JVD; There were no stridor. Neck is very supple. Breasts:: Not examined CHEST WALL: No tenderness CHEST: Normal respiration, clear to auscultation bilaterally.  HEART: Regular rate and rhythm.  There are no murmur, rub, or gallops.   BACK: No kyphosis or scoliosis; no CVA tenderness ABDOMEN: soft and non-tender; no masses, no organomegaly, normal abdominal bowel sounds; no pannus; no intertriginous candida. There is no rebound and no distention. Rectal Exam: Not done EXTREMITIES: No bone or joint deformity; age-appropriate arthropathy of the hands and knees; no edema; no ulcerations.  There is no calf tenderness. Genitalia: not examined PULSES: 2+ and symmetric SKIN: Normal hydration no rash or ulceration CNS: Cranial nerves 2-12 grossly intact no focal lateralizing neurologic deficit.  Speech is fluent; uvula elevated with phonation, facial symmetry and tongue midline. DTR are normal bilaterally, cerebella exam is intact, barbinski is negative and strengths are equaled bilaterally.  No sensory loss.   Labs on Admission:  Basic Metabolic Panel:  Recent Labs Lab 09/20/13 2036  NA 132*  K 2.8*  CL 97  CO2 24  GLUCOSE 97  BUN 40*  CREATININE 1.69*  CALCIUM 8.2*   Liver Function Tests: No results found for this basename: AST,  ALT, ALKPHOS, BILITOT, PROT, ALBUMIN,  in the last 168 hours No results found for this basename: LIPASE, AMYLASE,  in the last 168 hours No results found for this basename: AMMONIA,  in the last 168 hours CBC:  Recent Labs Lab 09/20/13 2036  WBC 13.0*  NEUTROABS 9.3*  HGB 9.8*  HCT 27.6*  MCV 82.6  PLT 237   Cardiac Enzymes: No results found for this basename: CKTOTAL, CKMB, CKMBINDEX, TROPONINI,  in the last 168 hours  CBG: No results found for this basename: GLUCAP,  in the last 168 hours   Radiological Exams on Admission: Dg Chest 2 View  09/20/2013   CLINICAL DATA:  Hypertension, hiccups.  EXAM: CHEST  2 VIEW  COMPARISON:  April 14, 2006.  FINDINGS: The heart size and mediastinal contours are within normal limits. Both lungs are clear. Anterior osteophyte formation of middle and lower thoracic spine is noted. No pleural effusion or pneumothorax is noted.  IMPRESSION: No active cardiopulmonary disease.   Electronically Signed   By: Roque Lias M.D.   On: 09/20/2013 21:11    EKG: Independently reviewed. NSR with no acute ST-T changes.   Assessment/Plan Present on Admission:  . Dehydration . Hypokalemia . Anemia . HTN (hypertension)  PLAN:  Will admit him for  volume depletion.  Will give some IVF.  He is also having moderate to severe hypokalemia.  I suspect he has it because of diarrhea on diuretics.  Will hold HCTZ, but continue Losartan.  He originally has hypotension, so will hold Verelan for now.  He may not be able to take both betablocker and calcium channel blocker in any event.  He also has anemia, and I don't have his past work up.  For now, will obtain stool guaics, anemia panel and follow Hct.  For his hiccups, will correct his K and see if it will resolve.  He is stable, full code, and will be admitted to [facility] service.  I will send stool for studies.  Thank you for letting me participate in his care.  Other plans as per orders.  Code Status: FULL  Unk Lightning, MD. [facility] Pager [phone] 7pm to 7am.  09/20/2013, 10:59 PM

## 2013-09-20 NOTE — ED Notes (Signed)
Pt c/o sob when having these hiccups, Pt is more concerned with SOB than Hiccups

## 2013-09-20 NOTE — ED Provider Notes (Signed)
CSN: [id number]     Arrival date & time 09/20/13  1933 History   First MD Initiated Contact with Patient 09/20/13 2001     Chief Complaint  Patient presents with  . Hiccups   (Consider location/radiation/quality/duration/timing/severity/associated sxs/prior Treatment) HPI Pt with no significant medical history reports about a week of persistent hiccups. He had a similar episode about 6 months ago, seen at [facility] and given Thorazine with good improvement. He saw his PCP about a week ago when these symptoms started and given an prescription for Thorazine which did not help. He denies any CP. Some SOB when he has a severe episode of hiccups but otherwise no SOB. Denies fever, occasional mild cough. No N/V/D.   Past Medical History  Diagnosis Date  . Hypertension    History reviewed. No pertinent past surgical history. History reviewed. No pertinent family history. History  Substance Use Topics  . Smoking status: Former Games developer  . Smokeless tobacco: Not on file  . Alcohol Use: No    Review of Systems All other systems reviewed and are negative except as noted in HPI.   Allergies  Review of patient's allergies indicates no known allergies.  Home Medications   Current Outpatient Rx  Name  Route  Sig  Dispense  Refill  . aspirin EC 81 MG tablet   Oral   Take 81 mg by mouth daily at 6 PM.         . atorvastatin (LIPITOR) 80 MG tablet   Oral   Take 80 mg by mouth daily at 6 PM.         . chlorproMAZINE (THORAZINE) 25 MG tablet   Oral   Take 25 mg by mouth 3 (three) times daily. 7 day course started 09/14/13 for hiccups         . ibuprofen (ADVIL,MOTRIN) 200 MG tablet   Oral   Take 400 mg by mouth 2 (two) times daily as needed (pain).         Marland Kitchen losartan-hydrochlorothiazide (HYZAAR) 100-25 MG per tablet   Oral   Take 1 tablet by mouth daily.         . propranolol ER (INDERAL LA) 120 MG 24 hr capsule   Oral   Take 120 mg by mouth daily.         . verapamil  (CALAN-SR) 240 MG CR tablet   Oral   Take 240 mg by mouth daily at 6 PM.          BP 100/47  Pulse 60  Temp(Src) 97.8 F (36.6 C) (Oral)  Resp 18  SpO2 99% Physical Exam  Nursing note and vitals reviewed. Constitutional: He is oriented to person, place, and time. He appears well-developed and well-nourished.  HENT:  Head: Normocephalic and atraumatic.  Eyes: EOM are normal. Pupils are equal, round, and reactive to light.  Neck: Normal range of motion. Neck supple.  Cardiovascular: Normal rate, normal heart sounds and intact distal pulses.   Pulmonary/Chest: Effort normal and breath sounds normal.  Persistent hiccups  Abdominal: Bowel sounds are normal. He exhibits no distension. There is no tenderness.  Musculoskeletal: Normal range of motion. He exhibits no edema and no tenderness.  Neurological: He is alert and oriented to person, place, and time. He has normal strength. No cranial nerve deficit or sensory deficit.  Skin: Skin is warm and dry. No rash noted.  Psychiatric: He has a normal mood and affect.    ED Course  Procedures (including critical care  time) Labs Review Labs Reviewed  CBC WITH DIFFERENTIAL - Abnormal; Notable for the following:    WBC 13.0 (*)    RBC 3.34 (*)    Hemoglobin 9.8 (*)    HCT 27.6 (*)    RDW 15.6 (*)    Neutro Abs 9.3 (*)    Monocytes Absolute 1.3 (*)    All other components within normal limits  BASIC METABOLIC PANEL - Abnormal; Notable for the following:    Sodium 132 (*)    Potassium 2.8 (*)    BUN 40 (*)    Creatinine, Ser 1.69 (*)    Calcium 8.2 (*)    GFR calc non Af Amer 38 (*)    GFR calc Af Amer 44 (*)    All other components within normal limits  TROPONIN I   Imaging Review Dg Chest 2 View  09/20/2013   CLINICAL DATA:  Hypertension, hiccups.  EXAM: CHEST  2 VIEW  COMPARISON:  April 14, 2006.  FINDINGS: The heart size and mediastinal contours are within normal limits. Both lungs are clear. Anterior osteophyte formation  of middle and lower thoracic spine is noted. No pleural effusion or pneumothorax is noted.  IMPRESSION: No active cardiopulmonary disease.   Electronically Signed   By: Roque Lias M.D.   On: 09/20/2013 21:11    EKG Interpretation    Date/Time:  Thursday September 20 2013 21:01:28 EST Ventricular Rate:  55 PR Interval:  190 QRS Duration: 86 QT Interval:  478 QTC Calculation: 457 R Axis:   87 Text Interpretation:  Sinus bradycardia Nonspecific ST abnormality Abnormal ECG Since last tracing today arm lead reversal fixed  compared to prior, no significant change Confirmed by SHELDON  MD, CHARLES ([id number]) on 09/20/2013 9:04:37 PM            MDM   1. Dehydration   2. Hypokalemia   3. Intractable hiccups   4. Anemia   5. HTN (hypertension)     Pt with moderate hypokalemia as well as evidence of pre-renal dehydration. Discussed with Dr. Conley Rolls who will admit for rehydration and repletion of K   Charles B. Bernette Mayers, MD 09/20/13 2321

## 2013-09-20 NOTE — ED Notes (Signed)
Pt blood pressure 100/50 MD aware, MD ordered bolus of fluid

## 2013-09-21 LAB — URINALYSIS, ROUTINE W REFLEX MICROSCOPIC
Bilirubin Urine: NEGATIVE
Glucose, UA: NEGATIVE mg/dL
Nitrite: NEGATIVE
Specific Gravity, Urine: 1.01 (ref 1.005–1.030)
Urobilinogen, UA: 1 mg/dL (ref 0.0–1.0)

## 2013-09-21 LAB — CBC
HCT: 27.5 % — ABNORMAL LOW (ref 39.0–52.0)
MCH: 28.7 pg (ref 26.0–34.0)
MCHC: 33.8 g/dL (ref 30.0–36.0)
Platelets: 231 10*3/uL (ref 150–400)
RDW: 15.6 % — ABNORMAL HIGH (ref 11.5–15.5)
WBC: 12.4 10*3/uL — ABNORMAL HIGH (ref 4.0–10.5)

## 2013-09-21 LAB — BASIC METABOLIC PANEL
BUN: 36 mg/dL — ABNORMAL HIGH (ref 6–23)
Calcium: 7.8 mg/dL — ABNORMAL LOW (ref 8.4–10.5)
Chloride: 100 mEq/L (ref 96–112)
GFR calc Af Amer: 49 mL/min — ABNORMAL LOW (ref >90)
GFR calc non Af Amer: 42 mL/min — ABNORMAL LOW (ref >90)
Potassium: 3.1 mEq/L — ABNORMAL LOW (ref 3.5–5.1)
Sodium: 133 mEq/L — ABNORMAL LOW (ref 135–145)

## 2013-09-21 LAB — RETICULOCYTES
RBC.: 3.26 MIL/uL — ABNORMAL LOW (ref 4.22–5.81)
Retic Count, Absolute: 29.3 10*3/uL (ref 19.0–186.0)
Retic Ct Pct: 0.9 % (ref 0.4–3.1)

## 2013-09-21 LAB — URINE MICROSCOPIC-ADD ON

## 2013-09-21 LAB — FERRITIN: Ferritin: 210 ng/mL (ref 22–322)

## 2013-09-21 LAB — TSH: TSH: 1.321 u[IU]/mL (ref 0.350–4.500)

## 2013-09-21 MED ORDER — POTASSIUM CHLORIDE CRYS ER 20 MEQ PO TBCR
40.0000 meq | EXTENDED_RELEASE_TABLET | Freq: Once | ORAL | Status: AC
  Administered 2013-09-21: 40 meq via ORAL

## 2013-09-21 MED ORDER — VITAMIN B-12 100 MCG PO TABS
100.0000 ug | ORAL_TABLET | Freq: Every day | ORAL | Status: DC
  Administered 2013-09-21 – 2013-09-22 (×2): 100 ug via ORAL
  Filled 2013-09-21 (×2): qty 1

## 2013-09-21 MED ORDER — SODIUM CHLORIDE 0.9 % IV SOLN
INTRAVENOUS | Status: DC
  Administered 2013-09-21: 10:00:00 via INTRAVENOUS

## 2013-09-21 MED ORDER — POTASSIUM CHLORIDE CRYS ER 20 MEQ PO TBCR: 40.0000 meq | EXTENDED_RELEASE_TABLET | Freq: Two times a day (BID) | ORAL | Status: DC

## 2013-09-21 NOTE — Progress Notes (Signed)
  New Admission Note:   Arrival Method: via stretcher from ED Mental Orientation:AOx4 Telemetry: box 21 Assessment: Completed Skin: dry, flaky at heels, scratch marks to buttocks and redness (blanchable)  IV:  LAC Pain: 0 Tubes: na Safety Measures: Safety Fall Prevention Plan has been given, discussed and signed Admission: Completed [address] [place] Orientation: Patient has been orientated to the room, unit and staff.  Family: not present at this time.   Orders have been reviewed and implemented. Will continue to monitor the patient. Call light has been placed within reach and bed alarm has been activated.   Theresa Mulligan  BSN, RN-BC Phone number: [phone]

## 2013-09-21 NOTE — Progress Notes (Signed)
[facility] PROGRESS NOTE  Andres Lopez ZOX:[id number] DOB: 02/21/1936 DOA: 09/20/2013 PCP: Londell Moh, MD  Assessment/Plan: 1. Acute renal failure unclear if on chronic. Likely related to ACE , and recent episode of diarrhea. Improved on IV fluids. Hold ACE.  2. Hyponatremia: improved with IV fluids.  3. Leukocytosis : reactive. Chest x ray negative. Will check UA.  4. Diarrhea: resolved.  5. Hypertension: continue with propanolol.  6. Anemia: will start B 12 supplement. Iron level pending. Further work up by PCP.  7. Hypokalemia: replace with 40 meq times one.   Code Status: Full Code.  Family Communication: care discussed with wife who was at bedside.  Disposition Plan: home in 1 or 2 days.    Consultants:  none  Procedures:  none  Antibiotics:  none  HPI/Subjective: Feeling better. Had diarrhea 1 week ago. denies abdominal pain.   Objective: Filed Vitals:   09/21/13 1017  BP: 104/50  Pulse: 53  Temp: 97.6 F (36.4 C)  Resp: 18    Intake/Output Summary (Last 24 hours) at 09/21/13 1643 Last data filed at 09/21/13 1500  Gross per 24 hour  Intake 818.33 ml  Output    700 ml  Net 118.33 ml   Filed Weights   09/20/13 2337  Weight: 78.104 kg (172 lb 3 oz)    Exam:   General:  NAD  Cardiovascular: S 1, S 2 RRR  Respiratory: CTA  Abdomen: distended, nt,   Musculoskeletal:no edema.    Data Reviewed: Basic Metabolic Panel:  Recent Labs Lab 09/20/13 2036 09/21/13 0132  NA 132* 133*  K 2.8* 3.1*  CL 97 100  CO2 24 19  GLUCOSE 97 159*  BUN 40* 36*  CREATININE 1.69* 1.54*  CALCIUM 8.2* 7.8*   Liver Function Tests: No results found for this basename: AST, ALT, ALKPHOS, BILITOT, PROT, ALBUMIN,  in the last 168 hours No results found for this basename: LIPASE, AMYLASE,  in the last 168 hours No results found for this basename: AMMONIA,  in the last 168 hours CBC:  Recent Labs Lab 09/20/13 2036 09/21/13 0132  WBC  13.0* 12.4*  NEUTROABS 9.3*  --   HGB 9.8* 9.3*  HCT 27.6* 27.5*  MCV 82.6 84.9  PLT 237 231   Cardiac Enzymes:  Recent Labs Lab 09/20/13 2036  TROPONINI <0.30   BNP (last 3 results) No results found for this basename: PROBNP,  in the last 8760 hours CBG: No results found for this basename: GLUCAP,  in the last 168 hours  No results found for this or any previous visit (from the past 240 hour(s)).   Studies: Dg Chest 2 View  09/20/2013   CLINICAL DATA:  Hypertension, hiccups.  EXAM: CHEST  2 VIEW  COMPARISON:  April 14, 2006.  FINDINGS: The heart size and mediastinal contours are within normal limits. Both lungs are clear. Anterior osteophyte formation of middle and lower thoracic spine is noted. No pleural effusion or pneumothorax is noted.  IMPRESSION: No active cardiopulmonary disease.   Electronically Signed   By: Roque Lias M.D.   On: 09/20/2013 21:11    Scheduled Meds: . atorvastatin  80 mg Oral q1800  . docusate sodium  100 mg Oral BID  . propranolol ER  120 mg Oral Daily  . sodium chloride  3 mL Intravenous Q12H  . vitamin B-12  100 mcg Oral Daily   Continuous Infusions: . sodium chloride 100 mL/hr at 09/21/13 1017    Active Problems:   Dehydration  Hypokalemia   Anemia   HTN (hypertension)    Time spent: 35 minutes.     Andres Lopez,Andres Lopez  [facility] Pager [phone]. If 7PM-7AM, please contact night-coverage at [email or web address], password [facility] 09/21/2013, 4:43 PM  LOS: 1 day

## 2013-09-21 NOTE — Progress Notes (Signed)
UR completed 

## 2013-09-22 LAB — BASIC METABOLIC PANEL
BUN: 22 mg/dL (ref 6–23)
CO2: 22 mEq/L (ref 19–32)
Calcium: 8.2 mg/dL — ABNORMAL LOW (ref 8.4–10.5)
Chloride: 109 mEq/L (ref 96–112)
GFR calc non Af Amer: 57 mL/min — ABNORMAL LOW (ref >90)
Glucose, Bld: 89 mg/dL (ref 70–99)
Potassium: 4 mEq/L (ref 3.5–5.1)
Sodium: 139 mEq/L (ref 135–145)

## 2013-09-22 LAB — CLOSTRIDIUM DIFFICILE BY PCR: Toxigenic C. Difficile by PCR: NEGATIVE

## 2013-09-22 LAB — CBC
Hemoglobin: 9.1 g/dL — ABNORMAL LOW (ref 13.0–17.0)
MCH: 29.1 pg (ref 26.0–34.0)
MCHC: 34 g/dL (ref 30.0–36.0)
Platelets: 244 10*3/uL (ref 150–400)
RBC: 3.13 MIL/uL — ABNORMAL LOW (ref 4.22–5.81)

## 2013-09-22 LAB — IRON AND TIBC: UIBC: 224 ug/dL (ref 125–400)

## 2013-09-22 MED ORDER — CHLORPROMAZINE HCL 25 MG PO TABS
25.0000 mg | ORAL_TABLET | Freq: Three times a day (TID) | ORAL | Status: DC
  Administered 2013-09-22: 25 mg via ORAL
  Filled 2013-09-22 (×3): qty 1

## 2013-09-22 MED ORDER — CYANOCOBALAMIN 100 MCG PO TABS: 100.0000 ug | ORAL_TABLET | Freq: Every day | ORAL | Status: DC

## 2013-09-22 MED ORDER — FERROUS SULFATE 325 (65 FE) MG PO TABS: 325.0000 mg | ORAL_TABLET | Freq: Two times a day (BID) | ORAL | Status: DC

## 2013-09-22 MED ORDER — ONDANSETRON HCL 4 MG PO TABS: 4.0000 mg | ORAL_TABLET | Freq: Four times a day (QID) | ORAL | Status: DC | PRN

## 2013-09-22 MED ORDER — CHLORPROMAZINE HCL 25 MG PO TABS: 25.0000 mg | ORAL_TABLET | Freq: Three times a day (TID) | ORAL | Status: DC

## 2013-09-22 NOTE — Progress Notes (Signed)
Patient discharge Home per Md order.  Discharge instructions reviewed with patient and family.  Copies of all forms given and explained. Patient/family voiced understanding of all instructions.  Discharge in no acute distress. Juergen Hardenbrook B. Aydon Swamy, RN BC, BSN, MSN 

## 2013-09-22 NOTE — Discharge Summary (Signed)
Physician Discharge Summary  Andres Lopez ZOX:[id number] DOB: 04-17-1936 DOA: 09/20/2013  PCP: Londell Moh, MD  Admit date: 09/20/2013 Discharge date: 09/22/2013  Time spent: 35 minutes  Recommendations for Outpatient Follow-up:  1. Needs B-met to follow renal function.  2. Follow HR and consider resuming verapamil.   Discharge Diagnoses:    Acute renal failure   Hyponatremia   Dehydration   Hypokalemia   Anemia   HTN (hypertension)   Discharge Condition: stable  Diet recommendation:Heart Healthy  [facility] Weights   09/20/13 2337 09/21/13 2118  Weight: 78.104 kg (172 lb 3 oz) 78.104 kg (172 lb 3 oz)    History of present illness:  Andres Lopez is an 77 y.o. male with hx of HTN on Losartan HCT, along with Verelan and Betablocker, hx of prolonged hiccups with last episode in 01/2013, presents to the ER with hiccups not relieved with Thorazine, and having mild shortness of breath without chest pain, coughs, fever or chills. He was evaluated and found to have K of 2.8 along with Cr of 1.6. Unfortunately, his last Cr was normal, but it was done in 2008. There was a paucity of medical information otherwise. He has a clear chest xray and negative EKG. He admitted to having diarrhea without abdominal pain fever or chills. He had some nausea and vomiting. He also found to have an anemia with Hb of 9.8 g/ dL, and there was no recent Hb to compare. He denied black or bloody stool and has no definite evidence of bleeding. He was given K, and hospitalist was asked to admit him for elevated Cr, hypokalemia, and anemia. He has no ill contact, distant travel, and lives with his wife who is well.   Hospital Course:  1. Acute renal failure unclear if on chronic. Likely related to ACE , and recent episode of diarrhea. Improved on IV fluids. Hold ACE at discharge. Cr peak to 1.69. Cr has decrease to 1.2. 2. Hyponatremia: resolved with IV fluids.  3. Leukocytosis : reactive. Chest x ray  negative. UA negative. WBC decrease to 10.9 4. Diarrhea: resolved.  5. Hypertension: continue with propanolol. Hold verapamil at discharge due to bradycardia.  6. Anemia: will start B 12 supplement. Iron low. Will start ferrous sulfate. Further work up by PCP.  7. Hypokalemia: resolved.  8. Hiccup: better. Continue with Thorazine.   Procedures:    Consultations:  none  Discharge Exam: Filed Vitals:   09/22/13 0450  BP: 128/67  Pulse: 65  Temp: 98.3 F (36.8 C)  Resp: 18    General: No distress.  Cardiovascular: S 1, S 2 RRR Respiratory: CTA  Discharge Instructions  Discharge Orders   Future Orders Complete By Expires   Diet - low sodium heart healthy  As directed    Increase activity slowly  As directed        Medication List    STOP taking these medications       ibuprofen 200 MG tablet  Commonly known as:  ADVIL,MOTRIN     losartan-hydrochlorothiazide 100-25 MG per tablet  Commonly known as:  HYZAAR     verapamil 240 MG CR tablet  Commonly known as:  CALAN-SR      TAKE these medications       aspirin EC 81 MG tablet  Take 81 mg by mouth daily at 6 PM.     atorvastatin 80 MG tablet  Commonly known as:  LIPITOR  Take 80 mg by mouth daily at 6 PM.  chlorproMAZINE 25 MG tablet  Commonly known as:  THORAZINE  Take 25 mg by mouth 3 (three) times daily. 7 day course started 09/14/13 for hiccups     cyanocobalamin 100 MCG tablet  Take 1 tablet (100 mcg total) by mouth daily.     ferrous sulfate 325 (65 FE) MG tablet  Take 1 tablet (325 mg total) by mouth 2 (two) times daily with a meal.     ondansetron 4 MG tablet  Commonly known as:  ZOFRAN  Take 1 tablet (4 mg total) by mouth every 6 (six) hours as needed for nausea.     propranolol ER 120 MG 24 hr capsule  Commonly known as:  INDERAL LA  Take 120 mg by mouth daily.       No Known Allergies    The results of significant diagnostics from this hospitalization (including imaging,  microbiology, ancillary and laboratory) are listed below for reference.    Significant Diagnostic Studies: Dg Chest 2 View  09/20/2013   CLINICAL DATA:  Hypertension, hiccups.  EXAM: CHEST  2 VIEW  COMPARISON:  April 14, 2006.  FINDINGS: The heart size and mediastinal contours are within normal limits. Both lungs are clear. Anterior osteophyte formation of middle and lower thoracic spine is noted. No pleural effusion or pneumothorax is noted.  IMPRESSION: No active cardiopulmonary disease.   Electronically Signed   By: Roque Lias M.D.   On: 09/20/2013 21:11    Microbiology: Recent Results (from the past 240 hour(s))  CLOSTRIDIUM DIFFICILE BY PCR     Status: None   Collection Time    09/21/13  7:47 PM      Result Value Range Status   C difficile by pcr NEGATIVE  NEGATIVE Final     Labs: Basic Metabolic Panel:  Recent Labs Lab 09/20/13 2036 09/21/13 0132 09/22/13 0605  NA 132* 133* 139  K 2.8* 3.1* 4.0  CL 97 100 109  CO2 24 19 22   GLUCOSE 97 159* 89  BUN 40* 36* 22  CREATININE 1.69* 1.54* 1.21  CALCIUM 8.2* 7.8* 8.2*   Liver Function Tests: No results found for this basename: AST, ALT, ALKPHOS, BILITOT, PROT, ALBUMIN,  in the last 168 hours No results found for this basename: LIPASE, AMYLASE,  in the last 168 hours No results found for this basename: AMMONIA,  in the last 168 hours CBC:  Recent Labs Lab 09/20/13 2036 09/21/13 0132 09/22/13 0605  WBC 13.0* 12.4* 10.9*  NEUTROABS 9.3*  --   --   HGB 9.8* 9.3* 9.1*  HCT 27.6* 27.5* 26.8*  MCV 82.6 84.9 85.6  PLT 237 231 244   Cardiac Enzymes:  Recent Labs Lab 09/20/13 2036  TROPONINI <0.30   BNP: BNP (last 3 results) No results found for this basename: PROBNP,  in the last 8760 hours CBG: No results found for this basename: GLUCAP,  in the last 168 hours     Signed:  Jerolene Kupfer  [facility] 09/22/2013, 10:00 AM

## 2013-09-22 NOTE — Evaluation (Signed)
Physical Therapy Evaluation Patient Details Name: Andres Lopez MRN: [id number] DOB: 01/25/1936 Today's Date: 09/22/2013 Time: 4098-1191 PT Time Calculation (min): 11 min  PT Assessment / Plan / Recommendation History of Present Illness  Pt adm with acute renal failure and hypokalemia.  Clinical Impression  Pt doing well with mobility and ready for dc home from PT standpoint.    PT Assessment  Patent does not need any further PT services    Follow Up Recommendations  No PT follow up    Does the patient have the potential to tolerate intense rehabilitation      Barriers to Discharge        Equipment Recommendations  None recommended by PT    Recommendations for Other Services     Frequency      Precautions / Restrictions Precautions Precautions: None   Pertinent Vitals/Pain See flow sheet.      Mobility  Bed Mobility Bed Mobility: Supine to Sit;Sitting - Scoot to Edge of Bed Supine to Sit: 6: Modified independent (Device/Increase time);HOB elevated Sitting - Scoot to Edge of Bed: 6: Modified independent (Device/Increase time) Details for Bed Mobility Assistance: Incr time Transfers Transfers: Sit to Stand;Stand to Sit Sit to Stand: 6: Modified independent (Device/Increase time);With upper extremity assist;From bed Stand to Sit: 6: Modified independent (Device/Increase time);With upper extremity assist;To bed Ambulation/Gait Ambulation/Gait Assistance: 6: Modified independent (Device/Increase time) Ambulation Distance (Feet): 125 Feet Assistive device: None Gait Pattern: Step-through pattern;Decreased stride length Gait velocity: decr    Exercises     PT Diagnosis:    PT Problem List:   PT Treatment Interventions:       PT Goals(Current goals can be found in the care plan section) Acute Rehab PT Goals PT Goal Formulation: No goals set, d/c therapy  Visit Information  Last PT Received On: 09/22/13 Assistance Needed: +1 History of Present Illness:  Pt adm with acute renal failure and hypokalemia.       Prior Functioning  Home Living Family/patient expects to be discharged to:: Private residence Living Arrangements: Spouse/significant other Available Help at Discharge: Family;Available 24 hours/day Prior Function Level of Independence: Independent Communication Communication: No difficulties    Cognition  Cognition Arousal/Alertness: Awake/alert Behavior During Therapy: WFL for tasks assessed/performed Overall Cognitive Status: Within Functional Limits for tasks assessed    Extremity/Trunk Assessment Upper Extremity Assessment Upper Extremity Assessment: Overall WFL for tasks assessed Lower Extremity Assessment Lower Extremity Assessment: Overall WFL for tasks assessed   Balance Balance Balance Assessed: Yes Static Standing Balance Static Standing - Balance Support: No upper extremity supported Static Standing - Level of Assistance: 6: Modified independent (Device/Increase time)  End of Session PT - End of Session Equipment Utilized During Treatment: Gait belt Activity Tolerance: Patient tolerated treatment well Patient left: in chair;with call bell/phone within reach;with family/visitor present Nurse Communication: Mobility status  GP     Shaunda Tipping 09/22/2013, 12:26 PM  [employer] PT [phone]

## 2013-09-25 DIAGNOSIS — D649 Anemia, unspecified: Secondary | ICD-10-CM | POA: Diagnosis not present

## 2013-09-25 DIAGNOSIS — Z86010 Personal history of colonic polyps: Secondary | ICD-10-CM | POA: Diagnosis not present

## 2013-09-25 DIAGNOSIS — E538 Deficiency of other specified B group vitamins: Secondary | ICD-10-CM | POA: Diagnosis not present

## 2013-09-25 DIAGNOSIS — E871 Hypo-osmolality and hyponatremia: Secondary | ICD-10-CM | POA: Diagnosis not present

## 2013-09-25 DIAGNOSIS — E876 Hypokalemia: Secondary | ICD-10-CM | POA: Diagnosis not present

## 2013-09-25 LAB — STOOL CULTURE: Special Requests: NORMAL

## 2013-09-26 ENCOUNTER — Telehealth: Payer: Self-pay | Admitting: Internal Medicine

## 2013-09-26 NOTE — Telephone Encounter (Signed)
LVOM FOR PT TO RETURN CALL IN RE TO REFERRAL.  °

## 2013-09-28 ENCOUNTER — Telehealth: Payer: Self-pay

## 2013-09-28 ENCOUNTER — Telehealth: Payer: Self-pay | Admitting: Internal Medicine

## 2013-09-28 DIAGNOSIS — D509 Iron deficiency anemia, unspecified: Secondary | ICD-10-CM | POA: Diagnosis not present

## 2013-09-28 NOTE — Telephone Encounter (Signed)
PT RETURN CALL AND WAS GIVEN NP APPT 12/17 @ 1:30 W/DR. CHISM REFERRING DR. PHARR. DX- CHRONIC ANEMIA WELCOME PACKET MAILED.

## 2013-09-28 NOTE — Telephone Encounter (Signed)
C/D 09/28/13 for appt. 10/10/13

## 2013-09-28 NOTE — Telephone Encounter (Signed)
LEFT PT VM TO RETURN CALL IN REF TO NP APPT. °

## 2013-10-02 DIAGNOSIS — E538 Deficiency of other specified B group vitamins: Secondary | ICD-10-CM | POA: Diagnosis not present

## 2013-10-02 DIAGNOSIS — I10 Essential (primary) hypertension: Secondary | ICD-10-CM | POA: Diagnosis not present

## 2013-10-09 ENCOUNTER — Ambulatory Visit (INDEPENDENT_AMBULATORY_CARE_PROVIDER_SITE_OTHER): Payer: Medicare Other | Admitting: Neurology

## 2013-10-09 ENCOUNTER — Encounter: Payer: Self-pay | Admitting: Neurology

## 2013-10-09 VITALS — BP 139/77 | HR 63 | Ht 65.25 in | Wt 173.0 lb

## 2013-10-09 DIAGNOSIS — G250 Essential tremor: Secondary | ICD-10-CM

## 2013-10-09 MED ORDER — TOPIRAMATE ER 50 MG PO CAP24: 50.0000 mg | ORAL_CAPSULE | Freq: Every day | ORAL | Status: DC

## 2013-10-09 NOTE — Progress Notes (Signed)
Subjective:    Patient ID: Andres Lopez is a 77 y.o. male.  HPI    Huston Foley, MD, PhD [facility] [address], [address] [address] [place], [place] [address]  Dear Dr. Renne Crigler,   I saw your patient, Andres Lopez for, upon your kind request in my neurologic clinic today for initial consultation of his tremors. The patient is accompanied by his wife today. As you know, Andres Lopez is a very pleasant 76 year old right-handed gentleman with an underlying medical history of hypertension, hyperlipidemia, diverticulitis, BPH with elevated PSA, who has had chronic upper extremity tremors for the past 5-6 years. He was treated with primidone some 2-3 years ago, by Dr. Quentin Mulling in [facility] and has been seeing him for the past 2-3 years. Primidone caused side effects, including depression and personality changes per wife. He has been on Inderal LA, which helped in the beginning, but does not help as much any more. He has been on it for the past 1-2 years, also through Dr. Quentin Mulling. He has not seen him in over 6 years. His mother had a hand and head tremor and a diagnosis of parkinson's disease. Looking back, he recalls, he had a intermittent hand tremor when he was in his 20s, but it improved or stabilized. He does not drink alcohol. He does not have a head or voice tremor, and no other family member with tremor. He denies balance problems and has not fallen. He has a more tremulous handwriting. He does not have glaucoma or kidney stones and does have some hearing loss. He had noise exposure at work. He worked at [employer] for 35 years. He quit smoking in '77.  He recently had blood work in your office including CMP, CBC with differential, vitamin B12, and iron and TIBC, but I do not have the results of those. His current medications include Inderal LA generic 120 mg once daily, baby aspirin, atorvastatin 80 mg daily, verapamil long-acting 240 mg once daily.  His Past Medical  History Is Significant For: Past Medical History  Diagnosis Date  . Hypertension     His Past Surgical History Is Significant For: Past Surgical History  Procedure Laterality Date  . Tumor removal      neck    His Family History Is Significant For: Family History  Problem Relation Age of Onset  . Parkinsonism Mother     His Social History Is Significant For: History   Social History  . Marital Status: Married    Spouse Name: mildred    Number of Children: 3  . Years of Education: 12   Social History Main Topics  . Smoking status: Former Games developer  . Smokeless tobacco: None  . Alcohol Use: No  . Drug Use: No  . Sexual Activity: No   Other Topics Concern  . None   Social History Narrative  . None    His Allergies Are:  Allergies  Allergen Reactions  . Tramadol   :   His Current Medications Are:  Outpatient Encounter Prescriptions as of 10/09/2013  Medication Sig  . aspirin EC 81 MG tablet Take 81 mg by mouth daily at 6 PM.  . atorvastatin (LIPITOR) 80 MG tablet Take 80 mg by mouth daily at 6 PM.  . chlorproMAZINE (THORAZINE) 25 MG tablet Take 1 tablet (25 mg total) by mouth 3 (three) times daily. 7 day course started 09/14/13 for hiccups  . ferrous sulfate 325 (65 FE) MG tablet Take 1 tablet (325 mg  total) by mouth 2 (two) times daily with a meal.  . losartan-hydrochlorothiazide (HYZAAR) 100-25 MG per tablet Take 1 tablet by mouth daily.  . propranolol ER (INDERAL LA) 120 MG 24 hr capsule Take 120 mg by mouth daily.  . vitamin B-12 (CYANOCOBALAMIN) 1000 MCG tablet Take 1,000 mcg by mouth daily.  . [DISCONTINUED] cyanocobalamin 100 MCG tablet Take 1,000 mcg by mouth daily.  . [DISCONTINUED] ondansetron (ZOFRAN) 4 MG tablet Take 1 tablet (4 mg total) by mouth every 6 (six) hours as needed for nausea.  . [DISCONTINUED] vitamin B-12 100 MCG tablet Take 1 tablet (100 mcg total) by mouth daily.  :   Review of Systems:  Out of a complete 14 point review of  systems, all are reviewed and negative with the exception of these symptoms as listed below:   Review of Systems  HENT:       Earring loss  Respiratory: Positive for cough.   Genitourinary:       Urination problems  Neurological: Positive for tremors.  Hematological: Bruises/bleeds easily.    Objective:  Neurologic Exam  Physical Exam Physical Examination:   Filed Vitals:   10/09/13 1334  BP: 139/77  Pulse: 63    General Examination: The patient is a very pleasant 76 y.o. male in no acute distress. He appears well-developed and well-nourished and well groomed.   HEENT: Normocephalic, atraumatic, pupils are equal, round and reactive to light and accommodation. Funduscopic exam is normal with sharp disc margins noted. Extraocular tracking is good without limitation to gaze excursion or nystagmus noted. Normal smooth pursuit is noted. Hearing is grossly intact. Tympanic membranes are clear bilaterally. Face is symmetric with normal facial animation and normal facial sensation. Speech is clear with no dysarthria noted. There is no hypophonia. There is no lip, neck/head, jaw or voice tremor. Neck is supple with full range of passive and active motion. There are no carotid bruits on auscultation. Oropharynx exam reveals: mild mouth dryness, adequate dental hygiene with dentures in place and mild airway crowding, due to narrow airway entry. Mallampati is class II. Tongue protrudes centrally and palate elevates symmetrically. Tonsils are 1+.    Chest: Clear to auscultation without wheezing, rhonchi or crackles noted.  Heart: S1+S2+0, regular and normal without murmurs, rubs or gallops noted.   Abdomen: Soft, non-tender and non-distended with normal bowel sounds appreciated on auscultation.  Extremities: There is no pitting edema in the distal lower extremities bilaterally. Pedal pulses are intact.  Skin: Warm and dry without trophic changes noted. There are no varicose  veins.  Musculoskeletal: exam reveals no obvious joint deformities, tenderness or joint swelling or erythema.   Neurologically:  Mental status: The patient is awake, alert and oriented in all 4 spheres. His memory, attention, language and knowledge are appropriate. There is no aphasia, agnosia, apraxia or anomia. Speech is clear with normal prosody and enunciation. Thought process is linear. Mood is congruent and affect is normal.  Cranial nerves are as described above under HEENT exam. In addition, shoulder shrug is normal with equal shoulder height noted. Motor exam: Normal bulk, strength and tone is noted. There is no drift, or rebound. There is an intermittent faster resting tremor. There is a bilateral upper extremity postural and action tremor, which is moderate in degree. There tremor frequency is fairly fast and the amplitude is small. On Archimedes spiral drawing there is mild to moderate tremulousness noted. Handwriting is mildly tremulousness, but legible. There is evidence of mild micrographia.  Romberg is  negative. Reflexes are 2+ throughout. Toes are downgoing bilaterally. Fine motor skills are mildly impaired with respect to finger taps, hand movements, rapid alternating patting, foot taps and foot agility.  Cerebellar testing shows no dysmetria or intention tremor on finger to nose testing. Heel to shin is unremarkable bilaterally. There is no truncal or gait ataxia.  Sensory exam is intact to light touch, pinprick, vibration, temperature sense in the upper and lower extremities.  Gait, station and balance: He stands up with mild difficulty and posture is mildly stooped, probably age-appropriate. He does walk with good stride length in pace but does have reduced arm swing bilaterally. No veering to one side is noted. No leaning to one side is noted. Posture is age-appropriate and stance is narrow based. No problems turning are noted. He turns in 3 steps. Tandem walk is difficult for him.                 Assessment and Plan:   In summary, Andres Lopez is a very pleasant 77 y.o.-year old male with an underlying medical history of hypertension, hyperlipidemia, diverticulitis, BPH with elevated PSA, whose history and physical exam are in keeping with essential tremor, affecting both upper extremities. He has a family history of Parkinson's disease and while he does have a resting component of his tremors and a mildly micrographic handwriting as well as reduced arm swing bilaterally I do not detect much in the way of parkinsonian features. He has probably a higher risk to develop Parkinson's disease given his family history but at this juncture he does not have evidence of Parkinson's disease and I reassured them in this regard. He has been tried on primidone in the past with side effects reported and has been on a beta blocker which helped in the beginning but does not help as much anymore. I had a long discussion with the patient and his wife regarding essential tremor and its prognosis and treatment options. I advised them that treatment options are somewhat limited, as there are not a whole lot of new medications out there to treat essential tremor. He has tried the 2 first-line medication at this juncture. I recommended that we try him on topiramate long-acting, in the form of Trokendi XR, starting low at 25 mg each night for one week and then increasing it to 50 mg each night thereafter. I provided him with his first 2 weeks and samples. I also provided him with a prescription for the 50 mg strength to be felt after he is sure that he can tolerate the medication. If he can tolerate it and has noted some improvement in his tremors we can certainly increase it down the Road. I talked to him about, and and potential serious side effects today in detail and wrote those down for him and his instructions as well. He is advised to get a TSH tested today on his way out. Otherwise I will hold off on  doing any other imaging testing such as a brain MRI as he has a fairly benign neurologic exam.  I answered all their questions today and the patient and his wife were in agreement with the above outlined plan. I would like to see the patient back in 3 months, sooner if the need arises and encouraged them to call with any interim questions, concerns, problems or updates and refill requests and test results.  Thank you very much for allowing me to participate in the care of this nice patient. If I can  be of any further assistance to you please do not hesitate to call me at [phone].  Sincerely,   Star Age, MD, PhD

## 2013-10-09 NOTE — Patient Instructions (Signed)
I think overall you are doing fairly well but I do want to suggest a few things today:  Remember to drink plenty of fluid, eat healthy meals and do not skip any meals. Try to eat protein with a every meal and eat a healthy snack such as fruit or nuts in between meals. Try to keep a regular sleep-wake schedule and try to exercise daily, particularly in the form of walking, 20-30 minutes a day, if you can.   As far as your medications are concerned, I would like to suggest a trial of Trokendi XR: One pill each bedtime for one week of the 25 mg strength (1 week of samples provided), then 50 mg: one pill at bedtime daily thereafter (1 week of samples provided). I have also given you a prescription for the 50 mg strength to fill after the samples are finished and you are able to tolerate the medication. Common side effects reported are: Sedation, sleepiness, tingling, change in taste especially with carbonated drinks, and rare side effects are glaucoma, kidney stones, and problems with thinking including word finding difficulties.   As far as diagnostic testing: we will do a blood test.   I would like to see you back in 3 months, sooner if we need to. Please call us with any interim questions, concerns, problems, updates or refill requests.  Please also call us for any test results so we can go over those with you on the phone. Brett Canales is my clinical assistant and will answer any of your questions and relay your messages to me and also relay most of my messages to you.  Our phone number is [phone]. We also have an after hours call service for urgent matters and there is a physician on-call for urgent questions. For any emergencies you know to call 911 or go to the nearest emergency room.

## 2013-10-10 ENCOUNTER — Other Ambulatory Visit (HOSPITAL_BASED_OUTPATIENT_CLINIC_OR_DEPARTMENT_OTHER): Payer: Medicare Other

## 2013-10-10 ENCOUNTER — Telehealth: Payer: Self-pay | Admitting: Internal Medicine

## 2013-10-10 ENCOUNTER — Other Ambulatory Visit: Payer: Self-pay | Admitting: Internal Medicine

## 2013-10-10 ENCOUNTER — Other Ambulatory Visit (INDEPENDENT_AMBULATORY_CARE_PROVIDER_SITE_OTHER): Payer: Medicare Other

## 2013-10-10 ENCOUNTER — Ambulatory Visit (HOSPITAL_BASED_OUTPATIENT_CLINIC_OR_DEPARTMENT_OTHER): Payer: Medicare Other | Admitting: Internal Medicine

## 2013-10-10 ENCOUNTER — Ambulatory Visit: Payer: Medicare Other

## 2013-10-10 VITALS — BP 134/65 | HR 67 | Temp 98.1°F | Resp 18 | Ht 65.0 in | Wt 171.6 lb

## 2013-10-10 DIAGNOSIS — G250 Essential tremor: Secondary | ICD-10-CM | POA: Diagnosis not present

## 2013-10-10 DIAGNOSIS — D649 Anemia, unspecified: Secondary | ICD-10-CM

## 2013-10-10 DIAGNOSIS — Z0289 Encounter for other administrative examinations: Secondary | ICD-10-CM

## 2013-10-10 LAB — CBC & DIFF AND RETIC
BASO%: 0.2 % (ref 0.0–2.0)
Basophils Absolute: 0 10*3/uL (ref 0.0–0.1)
EOS%: 1.7 % (ref 0.0–7.0)
HCT: 32.8 % — ABNORMAL LOW (ref 38.4–49.9)
MCH: 28.3 pg (ref 27.2–33.4)
MCHC: 32.9 g/dL (ref 32.0–36.0)
MONO#: 0.9 10*3/uL (ref 0.1–0.9)
NEUT#: 8.8 10*3/uL — ABNORMAL HIGH (ref 1.5–6.5)
RBC: 3.82 10*6/uL — ABNORMAL LOW (ref 4.20–5.82)
RDW: 16.1 % — ABNORMAL HIGH (ref 11.0–14.6)
Retic %: 2.43 % — ABNORMAL HIGH (ref 0.80–1.80)
Retic Ct Abs: 92.83 10*3/uL (ref 34.80–93.90)
WBC: 13.1 10*3/uL — ABNORMAL HIGH (ref 4.0–10.3)
lymph#: 3.1 10*3/uL (ref 0.9–3.3)

## 2013-10-10 LAB — COMPREHENSIVE METABOLIC PANEL (CC13)
Albumin: 3.3 g/dL — ABNORMAL LOW (ref 3.5–5.0)
Alkaline Phosphatase: 110 U/L (ref 40–150)
Anion Gap: 10 mEq/L (ref 3–11)
BUN: 16 mg/dL (ref 7.0–26.0)
CO2: 25 mEq/L (ref 22–29)
Calcium: 9.4 mg/dL (ref 8.4–10.4)
Chloride: 107 mEq/L (ref 98–109)
Glucose: 105 mg/dl (ref 70–140)
Sodium: 143 mEq/L (ref 136–145)
Total Bilirubin: 0.49 mg/dL (ref 0.20–1.20)
Total Protein: 7.1 g/dL (ref 6.4–8.3)

## 2013-10-10 LAB — CHCC SMEAR

## 2013-10-10 LAB — FERRITIN CHCC: Ferritin: 116 ng/ml (ref 22–316)

## 2013-10-10 NOTE — Telephone Encounter (Signed)
appts made per 12/17 POF AVS and CAL given shh °

## 2013-10-10 NOTE — Patient Instructions (Signed)
Anemia, Nonspecific Anemia is a condition in which the concentration of red blood cells or hemoglobin in the blood is below normal. Hemoglobin is a substance in red blood cells that carries oxygen to the tissues of the body. Anemia results in not enough oxygen reaching these tissues.  CAUSES  Common causes of anemia include:   Excessive bleeding. Bleeding may be internal or external. This includes excessive bleeding from periods (in women) or from the intestine.   Poor nutrition.   Chronic kidney, thyroid, and liver disease.  Bone marrow disorders that decrease red blood cell production.  Cancer and treatments for cancer.  HIV, AIDS, and their treatments.  Spleen problems that increase red blood cell destruction.  Blood disorders.  Excess destruction of red blood cells due to infection, medicines, and autoimmune disorders. SIGNS AND SYMPTOMS   Minor weakness.   Dizziness.   Headache.  Palpitations.   Shortness of breath, especially with exercise.   Paleness.  Cold sensitivity.  Indigestion.  Nausea.  Difficulty sleeping.  Difficulty concentrating. Symptoms may occur suddenly or they may develop slowly.  DIAGNOSIS  Additional blood tests are often needed. These help your health care provider determine the best treatment. Your health care provider will check your stool for blood and look for other causes of blood loss.  TREATMENT  Treatment varies depending on the cause of the anemia. Treatment can include:   Supplements of iron, vitamin B12, or folic acid.   Hormone medicines.   A blood transfusion. This may be needed if blood loss is severe.   Hospitalization. This may be needed if there is significant continual blood loss.   Dietary changes.  Spleen removal. HOME CARE INSTRUCTIONS Keep all follow-up appointments. It often takes many weeks to correct anemia, and having your health care provider check on your condition and your response to  treatment is very important. SEEK IMMEDIATE MEDICAL CARE IF:   You develop extreme weakness, shortness of breath, or chest pain.   You become dizzy or have trouble concentrating.  You develop heavy vaginal bleeding.   You develop a rash.   You have bloody or black, tarry stools.   You faint.   You vomit up blood.   You vomit repeatedly.   You have abdominal pain.  You have a fever or persistent symptoms for more than 2 3 days.   You have a fever and your symptoms suddenly get worse.   You are dehydrated.  MAKE SURE YOU:  Understand these instructions.  Will watch your condition.  Will get help right away if you are not doing well or get worse. Document Released: 11/18/2004 Document Revised: 06/13/2013 Document Reviewed: 04/06/2013 [facility] Patient Information 2014 [place], [place]. Hypokalemia Hypokalemia means a low potassium level in the blood.Potassium is an electrolyte that helps regulate the amount of fluid in the body. It also stimulates muscle contraction and maintains a stable acid-base balance.Most of the body's potassium is inside of cells, and only a very small amount is in the blood. Because the amount in the blood is so small, minor changes can have big effects. PREPARATION FOR TEST Testing for potassium requires taking a blood sample taken by needle from a vein in the arm. The skin is cleaned thoroughly before the sample is drawn. There is no other special preparation needed. NORMAL VALUES Potassium levels below 3.5 mEq/L are abnormally low. Levels above 5.1 mEq/L are abnormally high. Ranges for normal findings may vary among different laboratories and hospitals. You should always check with  your doctor after having lab work or other tests done to discuss the meaning of your test results and whether your values are considered within normal limits. MEANING OF TEST  Your caregiver will go over the test results with you and discuss the importance and  meaning of your results, as well as treatment options and the need for additional tests, if necessary. A potassium level is frequently part of a routine medical exam. It is usually included as part of a whole "panel" of tests for several blood salts (such as Sodium and Chloride). It may be done as part of follow-up when a low potassium level was found in the past or other blood salts are suspected of being out of balance. A low potassium level might be suspected if you have one or more of the following:  Symptoms of weakness.  Abnormal heart rhythms.  High blood pressure and are taking medication to control this, especially water pills (diuretics).  Kidney disease that can affect your potassium level .  Diabetes requiring the use of insulin. The potassium may fall after taking insulin, especially if the diabetes had been out of control for a while.  A condition requiring the use of cortisone-type medication or certain types of antibiotics.  Vomiting and/or diarrhea for more than a day or two.  A stomach or intestinal condition that may not permit appropriate absorption of potassium.  Fainting episodes.  Mental confusion. OBTAINING TEST RESULTS It is your responsibility to obtain your test results. Ask the lab or department performing the test when and how you will get your results.  Please contact your caregiver directly if you have not received the results within one week. At that time, ask if there is anything different or new you should be doing in relation to the results. TREATMENT Hypokalemia can be treated with potassium supplements taken by mouth and/or adjustments in your current medications. A diet high in potassium is also helpful. Foods with high potassium content are:  Peas, lentils, lima beans, nuts, and dried fruit.  Whole grain and bran cereals and breads.  Fresh fruit, vegetables (bananas, cantaloupe, grapefruit, oranges, tomatoes, honeydew melons,  potatoes).  Orange and tomato juices.  Meats. If potassium supplement has been prescribed for you today or your medications have been adjusted, see your personal caregiver in time02 for a re-check. SEEK MEDICAL CARE IF:  There is a feeling of worsening weakness.  You experience repeated chest palpitations.  You are diabetic and having difficulty keeping your blood sugars in the normal range.  You are experiencing vomiting and/or diarrhea.  You are having difficulty with any of your regular medications. SEEK IMMEDIATE MEDICAL CARE IF:  You experience chest pain, shortness of breath, or episodes of dizziness.  You have been having vomiting or diarrhea for more than 2 days.  You have a fainting episode. MAKE SURE YOU:   Understand these instructions.  Will watch your condition.  Will get help right away if you are not doing well or get worse. Document Released: 10/11/2005 Document Revised: 01/03/2012 Document Reviewed: 04/13/2013 [facility] Patient Information 2014 [place], [place]. Hydrochlorothiazide, HCTZ capsules or tablets What is this medicine? HYDROCHLOROTHIAZIDE (hye droe klor oh THYE a zide) is a diuretic. It increases the amount of urine passed, which causes the body to lose salt and water. This medicine is used to treat high blood pressure. It is also reduces the swelling and water retention caused by various medical conditions, such as heart, liver, or kidney disease. This medicine may  be used for other purposes; ask your health care provider or pharmacist if you have questions. COMMON BRAND NAME(S): Esidrix, Ezide, HydroDIURIL, Microzide, Oretic, Zide What should I tell my health care provider before I take this medicine? They need to know if you have any of these conditions: -diabetes -gout -immune system problems, like lupus -kidney disease or kidney stones -liver disease -pancreatitis -small amount of urine or difficulty passing urine -an unusual or allergic  reaction to hydrochlorothiazide, sulfa drugs, other medicines, foods, dyes, or preservatives -pregnant or trying to get pregnant -breast-feeding How should I use this medicine? Take this medicine by mouth with a glass of water. Follow the directions on the prescription label. Take your medicine at regular intervals. Remember that you will need to pass urine frequently after taking this medicine. Do not take your doses at a time of day that will cause you problems. Do not stop taking your medicine unless your doctor tells you to. Talk to your pediatrician regarding the use of this medicine in children. Special care may be needed. Overdosage: If you think you have taken too much of this medicine contact a poison control center or emergency room at once. NOTE: This medicine is only for you. Do not share this medicine with others. What if I miss a dose? If you miss a dose, take it as soon as you can. If it is almost time for your next dose, take only that dose. Do not take double or extra doses. What may interact with this medicine? -cholestyramine -colestipol -digoxin -dofetilide -lithium -medicines for blood pressure -medicines for diabetes -medicines that relax muscles for surgery -other diuretics -steroid medicines like prednisone or cortisone This list may not describe all possible interactions. Give your health care provider a list of all the medicines, herbs, non-prescription drugs, or dietary supplements you use. Also tell them if you smoke, drink alcohol, or use illegal drugs. Some items may interact with your medicine. What should I watch for while using this medicine? Visit your doctor or health care professional for regular checks on your progress. Check your blood pressure as directed. Ask your doctor or health care professional what your blood pressure should be and when you should contact him or her. You may need to be on a special diet while taking this medicine. Ask your  doctor. Check with your doctor or health care professional if you get an attack of severe diarrhea, nausea and vomiting, or if you sweat a lot. The loss of too much body fluid can make it dangerous for you to take this medicine. You may get drowsy or dizzy. Do not drive, use machinery, or do anything that needs mental alertness until you know how this medicine affects you. Do not stand or sit up quickly, especially if you are an older patient. This reduces the risk of dizzy or fainting spells. Alcohol may interfere with the effect of this medicine. Avoid alcoholic drinks. This medicine may affect your blood sugar level. If you have diabetes, check with your doctor or health care professional before changing the dose of your diabetic medicine. This medicine can make you more sensitive to the sun. Keep out of the sun. If you cannot avoid being in the sun, wear protective clothing and use sunscreen. Do not use sun lamps or tanning beds/booths. What side effects may I notice from receiving this medicine? Side effects that you should report to your doctor or health care professional as soon as possible: -allergic reactions such as skin rash  or itching, hives, swelling of the lips, mouth, tongue, or throat -changes in vision -chest pain -eye pain -fast or irregular heartbeat -feeling faint or lightheaded, falls -gout attack -muscle pain or cramps -pain or difficulty when passing urine -pain, tingling, numbness in the hands or feet -redness, blistering, peeling or loosening of the skin, including inside the mouth -unusually weak or tired Side effects that usually do not require medical attention (report to your doctor or health care professional if they continue or are bothersome): -change in sex drive or performance -dry mouth -headache -stomach upset This list may not describe all possible side effects. Call your doctor for medical advice about side effects. You may report side effects to FDA at  1-800-FDA-1088. Where should I keep my medicine? Keep out of the reach of children. Store at room temperature between 15 and 30 degrees C (59 and 86 degrees F). Do not freeze. Protect from light and moisture. Keep container closed tightly. Throw away any unused medicine after the expiration date. NOTE: This sheet is a summary. It may not cover all possible information. If you have questions about this medicine, talk to your doctor, pharmacist, or health care provider.  2014, [employer]vier/[employer] Standard. (2010-06-05 12:57:37)

## 2013-10-11 LAB — TSH: TSH: 3.35 u[IU]/mL (ref 0.450–4.500)

## 2013-10-11 NOTE — Progress Notes (Signed)
Quick Note:  Please call patient and advise him that his TSH, which is a thyroid function screening test, was normal. No further action is required at this time on this test. Please advise him to keep any pending appointments or tests. Huston Foley, MD, PhD [facility] ([facility]  ______

## 2013-10-11 NOTE — Progress Notes (Signed)
Quick Note:  Shared normal labs with patient per Dr Athar's findings, he verbalized understanding ______ 

## 2013-10-12 NOTE — Progress Notes (Signed)
[facility] [phone]   [phone]  NEW PATIENT EVALUATION   Name: Andres Lopez Date: 10/12/2013 MRN: [id number] DOB: May 18, 1936  PCP: Londell Moh, MD   REFERRING PHYSICIAN: Londell Moh,*  REASON FOR REFERRAL: Chronic anemia worsening  HISTORY OF PRESENT ILLNESS:Andres Lopez is a 77 y.o. male who is being referred by Dr. Renne Crigler for management and evaluation of his chronic anemia.  Today, he is accompanied by his wife Rhunette Croft.  He also has a history of hypertension, hyperlipidemia, diverticulitis, BPH with elevated PSA, who has a chronic upper extremity for the past 5-6 years.  He was recently evaluated by Dr. Frances Furbish for tremors and she felt it to be likely be benign tremors and he started propanolol on 10/09/2013.  He reports that he has been evaluated for anemia about 5 years ago and had decreased iron stores and he was prescribed iron.  He reports a colonoscopy six months ago with 3 polyps removed and instructed to follow-up in 3 years.  This was done by Dr. Bosie Clos.  He reports feeling tired but he denies dyspnea on exertion or fevers or chills.  He has had hiccups during Thanksgiving resulting in vomiting that resolved over the course of several days.  He reports starting iron supplementation again about 2 weeks ago.     PAST MEDICAL HISTORY:  has a past medical history of Hypertension.   PAST SURGICAL HISTORY: Past Surgical History  Procedure Laterality Date  . Tumor removal      neck    CURRENT MEDICATIONS: has a current medication list which includes the following prescription(s): amlodipine, aspirin ec, atorvastatin, chlorpromazine, ferrous sulfate, losartan-hydrochlorothiazide, propranolol er, topiramate er, and vitamin b-12.   ALLERGIES: Tramadol   SOCIAL HISTORY:  reports that he has quit smoking. He does not have any smokeless tobacco history on file. He reports that he does not drink alcohol or use illicit  drugs.   FAMILY HISTORY: family history includes Parkinsonism in his mother.   LABORATORY DATA:  Results for orders placed in visit on 10/10/13 (from the past 48 hour(s))  CBC & DIFF AND RETIC     Status: Abnormal   Collection Time    10/10/13  1:19 PM      Result Value Range   WBC 13.1 (*) 4.0 - 10.3 10e3/uL   NEUT# 8.8 (*) 1.5 - 6.5 10e3/uL   HGB 10.8 (*) 13.0 - 17.1 g/dL   HCT 14.7 (*) 82.9 - 56.2 %   Platelets 366  140 - 400 10e3/uL   MCV 85.9  79.3 - 98.0 fL   MCH 28.3  27.2 - 33.4 pg   MCHC 32.9  32.0 - 36.0 g/dL   RBC 1.30 (*) 8.65 - 7.84 10e6/uL   RDW 16.1 (*) 11.0 - 14.6 %   lymph# 3.1  0.9 - 3.3 10e3/uL   MONO# 0.9  0.1 - 0.9 10e3/uL   Eosinophils Absolute 0.2  0.0 - 0.5 10e3/uL   Basophils Absolute 0.0  0.0 - 0.1 10e3/uL   NEUT% 67.5  39.0 - 75.0 %   LYMPH% 23.9  14.0 - 49.0 %   MONO% 6.7  0.0 - 14.0 %   EOS% 1.7  0.0 - 7.0 %   BASO% 0.2  0.0 - 2.0 %   nRBC 0  0 - 0 %   Retic % 2.43 (*) 0.80 - 1.80 %   Retic Ct Abs 92.83  34.80 - 93.90 10e3/uL   Immature Retic Fract 16.70 (*)  3.00 - 10.60 %  [facility] SMEAR     Status: None   Collection Time    10/10/13  1:19 PM      Result Value Range   Smear Result Smear Available    FERRITIN [facility]     Status: None   Collection Time    10/10/13  1:19 PM      Result Value Range   Ferritin 116  22 - 316 ng/ml  COMPREHENSIVE METABOLIC PANEL ([facility]13)     Status: Abnormal   Collection Time    10/10/13  1:19 PM      Result Value Range   Sodium 143  136 - 145 mEq/L   Potassium 3.3 (*) 3.5 - 5.1 mEq/L   Chloride 107  98 - 109 mEq/L   CO2 25  22 - 29 mEq/L   Glucose 105  70 - 140 mg/dl   BUN 09.8  7.0 - 11.9 mg/dL   Creatinine 1.2  0.7 - 1.3 mg/dL   Total Bilirubin 1.47  0.20 - 1.20 mg/dL   Alkaline Phosphatase 110  40 - 150 U/L   AST 14  5 - 34 U/L   ALT 10  0 - 55 U/L   Total Protein 7.1  6.4 - 8.3 g/dL   Albumin 3.3 (*) 3.5 - 5.0 g/dL   Calcium 9.4  8.4 - 82.9 mg/dL   Anion Gap 10  3 - 11 mEq/L     Iron/TIBC/Ferritin     Component Value Date/Time   IRON 19* 09/21/2013 0132   TIBC 243 09/21/2013 0132   FERRITIN 116 10/10/2013 1319   FERRITIN 210 09/21/2013 0132   Results for BLANTON, KARDELL (MRN [id number]) as of 10/12/2013 04:48  Ref. Range 09/22/2013 06:05 09/22/2013 11:18 10/10/2013 13:19 10/10/2013 13:19 10/10/2013 15:41  Hemoglobin Latest Range: 13.0-17.0 g/dL 9.1 (L)  78.4 (L)     RADIOGRAPHY: Dg Chest 2 View  09/20/2013   CLINICAL DATA:  Hypertension, hiccups.  EXAM: CHEST  2 VIEW  COMPARISON:  April 14, 2006.  FINDINGS: The heart size and mediastinal contours are within normal limits. Both lungs are clear. Anterior osteophyte formation of middle and lower thoracic spine is noted. No pleural effusion or pneumothorax is noted.  IMPRESSION: No active cardiopulmonary disease.   Electronically Signed   By: Roque Lias M.D.   On: 09/20/2013 21:11     REVIEW OF SYSTEMS:  Constitutional: Denies fevers, chills or abnormal weight loss Eyes: Denies blurriness of vision Ears, nose, mouth, throat, and face: Denies mucositis or sore throat Respiratory: Denies cough, dyspnea or wheezes Cardiovascular: Denies palpitation, chest discomfort or lower extremity swelling Gastrointestinal:  Denies nausea, heartburn or change in bowel habits Skin: Denies abnormal skin rashes Lymphatics: Denies new lymphadenopathy or easy bruising Neurological:Denies numbness, tingling or new weaknesses; positive for tremors Behavioral/Psych: Mood is stable, no new changes  All other systems were reviewed with the patient and are negative.  PHYSICAL EXAM:  height is 5\' 5"  (1.651 m) and weight is 171 lb 9.6 oz (77.837 kg). His oral temperature is 98.1 F (36.7 C). His blood pressure is 134/65 and his pulse is 67. His respiration is 18.    GENERAL:alert, no distress and comfortable; elderly male who appears his stated age with tremors.  SKIN: skin color, texture, turgor are normal, no rashes or significant lesions EYES: normal,  Conjunctiva are pink and non-injected, sclera clear OROPHARYNX:no exudate, no erythema and lips, buccal mucosa, and tongue normal  NECK: supple, thyroid normal size, non-tender,  without nodularity LYMPH:  no palpable lymphadenopathy in the cervical, axillary or inguinal LUNGS: clear to auscultation and percussion with normal breathing effort HEART: regular rate & rhythm and no murmurs and no lower extremity edema ABDOMEN:abdomen soft, non-tender and normal bowel sounds Musculoskeletal:no cyanosis of digits and no clubbing  NEURO: alert & oriented x 3 with fluent speech, no focal motor/sensory deficits   IMPRESSION: Andres Lopez is a 77 y.o. male with a history of   PLAN: 1.  Chronic anemia NOS.  --Today his hemoglobin has improved from 9.1 in 09/22/2013 to 10.8 today.  His MCV is 85.9 (normocytic).  He has normal plts of 366 with a mildly increase WBC of 13.1.  His RDW is 16.1.  His vitamin B12 was 232 on 09/21/2013; folate was 8.4.   --His hemoglobin appears to have improved since starting iron although his ferritin points to anemia of chronic disease.  He could also have a mild concomitant iron deficiency.  I would continue a trial of iron supplementation and repeat hemoglobin and iron indices in 4-6 weeks.  --GI work-up is fairly recent making a GI bleeding source less likely. Still he does have a documented history of diverticulosis seen on colonoscopy done on 12/19/2006 due to hemoccult positivity . The stability of his other counts including his plts and wbc makes a primary bone problem less likely but we discussed possible bone marrow if his chronic anemia does not improve.  Other considerations could be related to kidney dysfunction and we will check epo levels.   All questions were answered. The patient knows to call the clinic with any problems, questions or concerns. We can certainly see the patient much sooner if necessary.  I spent 25 minutes counseling the patient face to face.  The total time spent in the appointment was 45 minutes.    Alethia Melendrez, MD 10/12/2013 4:37 AM

## 2013-10-23 DIAGNOSIS — Z0000 Encounter for general adult medical examination without abnormal findings: Secondary | ICD-10-CM | POA: Diagnosis not present

## 2013-10-23 DIAGNOSIS — Z79899 Other long term (current) drug therapy: Secondary | ICD-10-CM | POA: Diagnosis not present

## 2013-10-23 DIAGNOSIS — E7800 Pure hypercholesterolemia, unspecified: Secondary | ICD-10-CM | POA: Diagnosis not present

## 2013-10-23 DIAGNOSIS — I10 Essential (primary) hypertension: Secondary | ICD-10-CM | POA: Diagnosis not present

## 2013-10-29 DIAGNOSIS — M76899 Other specified enthesopathies of unspecified lower limb, excluding foot: Secondary | ICD-10-CM | POA: Diagnosis not present

## 2013-10-29 DIAGNOSIS — D509 Iron deficiency anemia, unspecified: Secondary | ICD-10-CM | POA: Diagnosis not present

## 2013-10-29 DIAGNOSIS — D649 Anemia, unspecified: Secondary | ICD-10-CM | POA: Diagnosis not present

## 2013-10-29 DIAGNOSIS — N189 Chronic kidney disease, unspecified: Secondary | ICD-10-CM | POA: Diagnosis not present

## 2013-10-29 DIAGNOSIS — E7800 Pure hypercholesterolemia, unspecified: Secondary | ICD-10-CM | POA: Diagnosis not present

## 2013-12-03 ENCOUNTER — Telehealth: Payer: Self-pay | Admitting: Neurology

## 2013-12-03 DIAGNOSIS — R251 Tremor, unspecified: Secondary | ICD-10-CM

## 2013-12-03 MED ORDER — TOPIRAMATE 25 MG PO TABS: ORAL_TABLET | ORAL | Status: DC

## 2013-12-03 NOTE — Telephone Encounter (Signed)
Will start Topamax 50 mg: Take half a pill each bedtime for one week, then one pill at bedtime daily for one week, then 1-1/2 pills at bedtime daily for one week, then 2 pills at bedtime daily thereafter. Common side effects reported are: Sedation, sleepiness, tingling, change in taste especially with carbonated drinks, and rare side effects are glaucoma, kidney stones, and problems with thinking including word finding difficulties. In lieu of Trokendi XR, can you let pt know? Thanks,

## 2013-12-03 NOTE — Telephone Encounter (Signed)
I called and spoke with the patient.  Explained the change and possible side effects.  He verbalized understanding and will follow up with his pharmacy regarding when the med will be ready for pick up.  If he has any further questions, he will call us back.

## 2013-12-03 NOTE — Telephone Encounter (Signed)
Patient is unable to afford Trokendi, as it is not preferred on his ins plan.  He is unable to use the discount voucher since her has Medicare.  Could he be changed to something else, perhaps regular Tomiramate?  Please advise.  Thank you.

## 2013-12-03 NOTE — Telephone Encounter (Signed)
Pt called and asked to speak to [facility] or Dr. Rexene Alberts.  He stated that when he went to pick up his prescription for Trokeni XR it was too expensive for him.  He has asked if there is something cheaper that he can take.  Please call him at [phone].  Thank you.

## 2013-12-10 ENCOUNTER — Telehealth: Payer: Self-pay | Admitting: Internal Medicine

## 2013-12-10 ENCOUNTER — Encounter: Payer: Self-pay | Admitting: Internal Medicine

## 2013-12-10 ENCOUNTER — Other Ambulatory Visit (HOSPITAL_BASED_OUTPATIENT_CLINIC_OR_DEPARTMENT_OTHER): Payer: Medicare Other

## 2013-12-10 ENCOUNTER — Ambulatory Visit (HOSPITAL_BASED_OUTPATIENT_CLINIC_OR_DEPARTMENT_OTHER): Payer: Medicare Other | Admitting: Internal Medicine

## 2013-12-10 ENCOUNTER — Encounter (INDEPENDENT_AMBULATORY_CARE_PROVIDER_SITE_OTHER): Payer: Self-pay

## 2013-12-10 VITALS — BP 121/55 | HR 65 | Temp 97.8°F | Resp 18 | Ht 65.0 in | Wt 172.3 lb

## 2013-12-10 DIAGNOSIS — D649 Anemia, unspecified: Secondary | ICD-10-CM

## 2013-12-10 DIAGNOSIS — D72829 Elevated white blood cell count, unspecified: Secondary | ICD-10-CM | POA: Diagnosis not present

## 2013-12-10 LAB — CBC WITH DIFFERENTIAL/PLATELET
BASO%: 0.7 % (ref 0.0–2.0)
BASOS ABS: 0.1 10*3/uL (ref 0.0–0.1)
EOS ABS: 0.2 10*3/uL (ref 0.0–0.5)
EOS%: 1.5 % (ref 0.0–7.0)
HCT: 34.9 % — ABNORMAL LOW (ref 38.4–49.9)
HGB: 11.6 g/dL — ABNORMAL LOW (ref 13.0–17.1)
LYMPH%: 26.9 % (ref 14.0–49.0)
MCH: 29.2 pg (ref 27.2–33.4)
MCHC: 33.2 g/dL (ref 32.0–36.0)
MCV: 87.8 fL (ref 79.3–98.0)
MONO#: 0.7 10*3/uL (ref 0.1–0.9)
MONO%: 6.4 % (ref 0.0–14.0)
NEUT%: 64.5 % (ref 39.0–75.0)
NEUTROS ABS: 7.6 10*3/uL — AB (ref 1.5–6.5)
PLATELETS: 351 10*3/uL (ref 140–400)
RBC: 3.98 10*6/uL — AB (ref 4.20–5.82)
RDW: 16.1 % — ABNORMAL HIGH (ref 11.0–14.6)
WBC: 11.8 10*3/uL — ABNORMAL HIGH (ref 4.0–10.3)
lymph#: 3.2 10*3/uL (ref 0.9–3.3)

## 2013-12-10 LAB — IRON AND TIBC CHCC
%SAT: 17 % — ABNORMAL LOW (ref 20–55)
IRON: 57 ug/dL (ref 42–163)
TIBC: 348 ug/dL (ref 202–409)
UIBC: 290 ug/dL (ref 117–376)

## 2013-12-10 LAB — FERRITIN CHCC: Ferritin: 126 ng/ml (ref 22–316)

## 2013-12-10 NOTE — Progress Notes (Signed)
Andres Lopez  Andres Pel, MD [address] [place] [id number]  DIAGNOSIS: Anemia - Plan: CBC with Differential, CBC with Differential  Chief Complaint  Patient presents with  . Anemia, unspecified    INTERVAL HISTORY: Andres Lopez 78 y.o. male who was initially referred by Dr. Shelia Media for management and evaluation of his chronic anemia on 10/10/2013. Today, he is accompanied by his wife Andres Lopez.   As previously noted, he reports that he has been evaluated for anemia about 5 years ago and had decreased iron stores and he was prescribed iron. He reports a colonoscopy six months ago with 3 polyps removed and instructed to follow-up in 3 years. This was done by Dr. Michail Sermon. He reports feeling tired but he denies dyspnea on exertion or fevers or chills.  He reports starting iron supplementation in December.  He denies any symptoms of anemia such as palpitations, acute shortness or breath or chest pain.  He has not had any emergency room visits or hospitalizations.   MEDICAL HISTORY: Past Medical History  Diagnosis Date  . Hypertension     INTERIM HISTORY: has Dehydration; Hypokalemia; Anemia; and HTN (hypertension) on his problem list.    ALLERGIES:  is allergic to tramadol.  MEDICATIONS: has a current medication list which includes the following prescription(s): amlodipine, aspirin ec, atorvastatin, ferrous sulfate, losartan-hydrochlorothiazide, propranolol er, and topiramate.  SURGICAL HISTORY:  Past Surgical History  Procedure Laterality Date  . Tumor removal      neck    REVIEW OF SYSTEMS:   Constitutional: Denies fevers, chills or abnormal weight loss Eyes: Denies blurriness of vision Ears, nose, mouth, throat, and face: Denies mucositis or sore throat Respiratory: Denies cough, dyspnea or wheezes Cardiovascular: Denies palpitation, chest discomfort or lower extremity swelling Gastrointestinal:  Denies  nausea, heartburn or change in bowel habits Skin: Denies abnormal skin rashes Lymphatics: Denies new lymphadenopathy or easy bruising Neurological:Denies numbness, tingling or new weaknesses Behavioral/Psych: Mood is stable, no new changes  All other systems were reviewed with the patient and are negative.  PHYSICAL EXAMINATION: ECOG PERFORMANCE STATUS: 0 - Asymptomatic  Blood pressure 121/55, pulse 65, temperature 97.8 F (36.6 C), temperature source Oral, resp. rate 18, height '5\' 5"'  (1.651 m), weight 172 lb 4.8 oz (78.155 kg).  GENERAL:alert, no distress and comfortable; tremors but otherwise gets to table without difficulty. Mildly overweight.  SKIN: skin color, texture, turgor are normal, no rashes or significant lesions EYES: normal, Conjunctiva are pink and non-injected, sclera clear OROPHARYNX:no exudate, no erythema and lips, buccal mucosa, and tongue normal  NECK: supple, thyroid normal size, non-tender, without nodularity LYMPH:  no palpable lymphadenopathy in the cervical, axillary or supraclavicular LUNGS: clear to auscultation with normal breathing effort, no wheezes or rhonchi HEART: regular rate & rhythm and no murmurs and no lower extremity edema ABDOMEN:abdomen soft, non-tender and normal bowel sounds Musculoskeletal:no cyanosis of digits and no clubbing  NEURO: alert & oriented x 3 with fluent speech, no focal motor/sensory deficits  Labs:  Lab Results  Component Value Date   WBC 11.8* 12/10/2013   HGB 11.6* 12/10/2013   HCT 34.9* 12/10/2013   MCV 87.8 12/10/2013   PLT 351 12/10/2013   NEUTROABS 7.6* 12/10/2013      Chemistry      Component Value Date/Time   NA 143 10/10/2013 1319   NA 139 09/22/2013 0605   K 3.3* 10/10/2013 1319   K 4.0 09/22/2013 0605   CL 109 09/22/2013 8937  CO2 25 10/10/2013 1319   CO2 22 09/22/2013 0605   BUN 16.0 10/10/2013 1319   BUN 22 09/22/2013 0605   CREATININE 1.2 10/10/2013 1319   CREATININE 1.21 09/22/2013 0605       Component Value Date/Time   CALCIUM 9.4 10/10/2013 1319   CALCIUM 8.2* 09/22/2013 0605   ALKPHOS 110 10/10/2013 1319   ALKPHOS 93 05/08/2007 0027   AST 14 10/10/2013 1319   AST 19 05/08/2007 0027   ALT 10 10/10/2013 1319   ALT 18 05/08/2007 0027   BILITOT 0.49 10/10/2013 1319   BILITOT 0.7 05/08/2007 0027     Results for Andres Lopez, Andres Lopez (MRN [id number]) as of 12/10/2013 18:57  Ref. Range 09/22/2013 06:05 10/10/2013 13:19 12/10/2013 12:35  Hemoglobin Latest Range: 13.0-17.0 g/dL 9.1 (L) 10.8 (L) 11.6 (L)   Results for Andres Lopez, Andres Lopez (MRN [id number]) as of 12/10/2013 18:57  Ref. Range 09/22/2013 06:05 10/10/2013 13:19 12/10/2013 12:35  WBC Latest Range: 4.0-10.5 K/uL 10.9 (H) 13.1 (H) 11.8 (H)   GFR Estimated Creatinine Clearance: 51.3 ml/min (by C-G formula based on Cr of 1.2).  CBC:  Recent Labs Lab 12/10/13 1235  WBC 11.8*  NEUTROABS 7.6*  HGB 11.6*  HCT 34.9*  MCV 87.8  PLT 351    Anemia work up  Recent Labs  12/10/13 1235  FERRITIN 126  TIBC 348  IRON 57    Studies:  No results found.   RADIOGRAPHIC STUDIES: No results found.  ASSESSMENT: Andres Lopez 78 y.o. male with a history of Anemia - Plan: CBC with Differential, CBC with Differential   PLAN:   1. Chronic Anemia NOS.  --Today his hemoglobin has improved from 9.1 in 09/22/2013 to 10.8 last visit to 11.6 today.  His MCV is 87.8 (normocytic).  His RDW is 16.1. His vitamin B12 was 232 on 09/21/2013; folate was 8.4.  --His hemoglobin appears to have improved since starting iron although his ferritin points to anemia of chronic disease. He could also have a mild concomitant iron deficiency. I would continue a trial of iron supplementation and repeat hemoglobin and iron indices in 2 months.  --GI work-up is fairly recent making a GI bleeding source less likely. Still he does have a documented history of diverticulosis seen on colonoscopy done on 12/19/2006 due to hemoccult positivity.   2. Leukocytosis,  chronic.  --Predominate neutrophils. Patient denies stressors or being on steroids.  WBC are not increasing.  We will closely observe and consider sending for BCR-ABL if persistent.   If anemia worsens in light of this, we will obtain a bone marrow biopsy to rule out myeloproliferative disorder.     3. Follow-up.  --CBC in 2 months and follow up in 4 months with CBC and symptom visit.   All questions were answered. The patient knows to call the clinic with any problems, questions or concerns. We can certainly see the patient much sooner if necessary.  I spent 10 minutes counseling the patient face to face. The total time spent in the appointment was 15 minutes.    Deigo Alonso, MD 12/10/2013 6:51 PM

## 2013-12-10 NOTE — Telephone Encounter (Signed)
gv and pritned appts sched and avs for pt for April and JUNE

## 2013-12-14 DIAGNOSIS — N039 Chronic nephritic syndrome with unspecified morphologic changes: Secondary | ICD-10-CM | POA: Diagnosis not present

## 2013-12-14 DIAGNOSIS — N184 Chronic kidney disease, stage 4 (severe): Secondary | ICD-10-CM | POA: Diagnosis not present

## 2013-12-14 DIAGNOSIS — N2581 Secondary hyperparathyroidism of renal origin: Secondary | ICD-10-CM | POA: Diagnosis not present

## 2013-12-14 DIAGNOSIS — I129 Hypertensive chronic kidney disease with stage 1 through stage 4 chronic kidney disease, or unspecified chronic kidney disease: Secondary | ICD-10-CM | POA: Diagnosis not present

## 2013-12-14 DIAGNOSIS — D631 Anemia in chronic kidney disease: Secondary | ICD-10-CM | POA: Diagnosis not present

## 2013-12-14 DIAGNOSIS — N390 Urinary tract infection, site not specified: Secondary | ICD-10-CM | POA: Diagnosis not present

## 2013-12-20 DIAGNOSIS — D509 Iron deficiency anemia, unspecified: Secondary | ICD-10-CM | POA: Diagnosis not present

## 2013-12-27 ENCOUNTER — Other Ambulatory Visit (HOSPITAL_COMMUNITY): Payer: Self-pay

## 2013-12-28 ENCOUNTER — Ambulatory Visit (HOSPITAL_COMMUNITY)
Admission: RE | Admit: 2013-12-28 | Discharge: 2013-12-28 | Disposition: A | Discharge status: Home / Self Care | Payer: Medicare Other | Source: Ambulatory Visit | Attending: Nephrology | Admitting: Nephrology

## 2013-12-28 DIAGNOSIS — D631 Anemia in chronic kidney disease: Secondary | ICD-10-CM | POA: Diagnosis not present

## 2013-12-28 DIAGNOSIS — N184 Chronic kidney disease, stage 4 (severe): Secondary | ICD-10-CM | POA: Diagnosis not present

## 2013-12-28 DIAGNOSIS — N039 Chronic nephritic syndrome with unspecified morphologic changes: Secondary | ICD-10-CM | POA: Diagnosis not present

## 2013-12-28 MED ORDER — SODIUM CHLORIDE 0.9 % IV SOLN
1020.0000 mg | Freq: Once | INTRAVENOUS | Status: AC
  Administered 2013-12-28: 1020 mg via INTRAVENOUS
  Filled 2013-12-28: qty 34

## 2014-01-03 DIAGNOSIS — N184 Chronic kidney disease, stage 4 (severe): Secondary | ICD-10-CM | POA: Diagnosis not present

## 2014-01-15 ENCOUNTER — Encounter (INDEPENDENT_AMBULATORY_CARE_PROVIDER_SITE_OTHER): Payer: Self-pay

## 2014-01-15 ENCOUNTER — Ambulatory Visit (INDEPENDENT_AMBULATORY_CARE_PROVIDER_SITE_OTHER): Payer: Medicare Other | Admitting: Neurology

## 2014-01-15 ENCOUNTER — Encounter: Payer: Self-pay | Admitting: Neurology

## 2014-01-15 VITALS — BP 120/64 | HR 55 | Temp 97.7°F | Ht 65.25 in | Wt 172.0 lb

## 2014-01-15 DIAGNOSIS — G252 Other specified forms of tremor: Secondary | ICD-10-CM | POA: Diagnosis not present

## 2014-01-15 DIAGNOSIS — G250 Essential tremor: Secondary | ICD-10-CM | POA: Diagnosis not present

## 2014-01-15 DIAGNOSIS — D631 Anemia in chronic kidney disease: Secondary | ICD-10-CM | POA: Diagnosis not present

## 2014-01-15 DIAGNOSIS — Z820 Family history of epilepsy and other diseases of the nervous system: Secondary | ICD-10-CM | POA: Diagnosis not present

## 2014-01-15 DIAGNOSIS — N183 Chronic kidney disease, stage 3 unspecified: Secondary | ICD-10-CM | POA: Diagnosis not present

## 2014-01-15 DIAGNOSIS — N2581 Secondary hyperparathyroidism of renal origin: Secondary | ICD-10-CM | POA: Diagnosis not present

## 2014-01-15 DIAGNOSIS — I129 Hypertensive chronic kidney disease with stage 1 through stage 4 chronic kidney disease, or unspecified chronic kidney disease: Secondary | ICD-10-CM | POA: Diagnosis not present

## 2014-01-15 HISTORY — DX: Essential tremor: G25.0

## 2014-01-15 MED ORDER — PROPRANOLOL HCL ER 120 MG PO CP24: 120.0000 mg | ORAL_CAPSULE | Freq: Every day | ORAL | Status: DC

## 2014-01-15 MED ORDER — TOPIRAMATE 25 MG PO TABS: ORAL_TABLET | ORAL | Status: DC

## 2014-01-15 NOTE — Progress Notes (Signed)
Subjective:    Patient ID: Andres Lopez is a 78 y.o. male.  HPI    Interim history:   Andres Lopez is a very pleasant 78 year old right-handed gentleman with an underlying medical history of hypertension, hyperlipidemia, diverticulitis, BPH with elevated PSA, who presents for followup consultation of his essential tremor. He is unaccompanied today. I first met him on 10/09/2013, at which time I felt he most likely had essential tremor. He does have a family history of Parkinson's disease. He was tried on primidone in the past and had side effects as well as on a beta blocker which stopped working. I suggested a trial of Trokendi XR, but the patient's insurance did not cover it and he could not afford it. I switched him to generic Topamax. I checked a TSH, which was normal.   Today, he reports that his tremor is better and he is tolerating the topiramate, which is currently 25 mg, 2 at night. He has had no changes in his other medications or medical history, except for sinus congestion for which she took an over-the-counter medication.   He has had a chronic upper extremity tremor for the past 6 years. He was treated with primidone some 3 years ago, by Dr. Justine Null in [facility] and has been seeing him for the past 3 years. Primidone caused side effects, including depression and personality changes per wife. He has been on Inderal LA, which helped in the beginning, but then stopped working. He was on it for about 2 years, also through Dr. Justine Null. He had not seen him in years. His mother had a hand and head tremor and a diagnosis of parkinson's disease. Looking back, he recalls, he had a intermittent hand tremor when he was in his 20s, but it improved or stabilized. He does not drink alcohol. He does not have a head or voice tremor, and no other family member with tremor. He denies balance problems and has not fallen. He has a more tremulous handwriting. He does not have glaucoma or kidney stones and  does have some hearing loss. He had noise exposure at work. He worked at [employer] for 35 years. He quit smoking in '77.   His Past Medical History Is Significant For: Past Medical History  Diagnosis Date  . Hypertension   . Essential tremor 01/15/2014   His Past Surgical History Is Significant For: Past Surgical History  Procedure Laterality Date  . Tumor removal      neck   His Family History Is Significant For: Family History  Problem Relation Age of Onset  . Parkinsonism Mother    His Social History Is Significant For: History   Social History  . Marital Status: Married    Spouse Name: Andres Lopez    Number of Children: 3  . Years of Education: 12   Social History Main Topics  . Smoking status: Former Research scientist (life sciences)  . Smokeless tobacco: None  . Alcohol Use: No  . Drug Use: No  . Sexual Activity: No   Other Topics Concern  . None   Social History Narrative  . None    His Allergies Are:  Allergies  Allergen Reactions  . Tramadol Other (See Comments)    "makes him crazy"  :   His Current Medications Are:  Outpatient Encounter Prescriptions as of 01/15/2014  Medication Sig  . amLODipine (NORVASC) 5 MG tablet Take 5 mg by mouth daily.  Marland Kitchen aspirin EC 81 MG tablet Take 81 mg by mouth daily at 6  PM.  . atorvastatin (LIPITOR) 80 MG tablet Take 80 mg by mouth daily at 6 PM.  . ferrous sulfate 325 (65 FE) MG tablet Take 1 tablet (325 mg total) by mouth 2 (two) times daily with a meal.  . losartan-hydrochlorothiazide (HYZAAR) 100-25 MG per tablet Take 1 tablet by mouth daily.  . propranolol ER (INDERAL LA) 120 MG 24 hr capsule Take 120 mg by mouth daily.  Marland Kitchen topiramate (TOPAMAX) 25 MG tablet 1/2 pill each bedtime x 1 week, then 1 pill nightly x 1 week, then 1-1/2 pills nightly x 1 week, then 2 pills nightly thereafter.  : Review of Systems:  Out of a complete 14 point review of systems, all are reviewed and negative with the exception of these symptoms as listed below:   Review of Systems  Constitutional: Negative.   HENT: Negative.   Eyes: Negative.   Respiratory: Negative.   Cardiovascular: Negative.   Gastrointestinal: Negative.   Endocrine: Negative.   Genitourinary: Positive for urgency and frequency.  Musculoskeletal: Positive for arthralgias.  Skin: Negative.   Allergic/Immunologic: Negative.   Neurological: Positive for tremors.  Hematological: Negative.   Psychiatric/Behavioral: Negative.     Objective:  Neurologic Exam  Physical Exam Physical Examination:   Filed Vitals:   01/15/14 1052  BP: 120/64  Pulse: 55  Temp: 97.7 F (36.5 C)   General Examination: The patient is a very pleasant 78 y.o. male in no acute distress. He appears well-developed and well-nourished and well groomed.   HEENT: Normocephalic, atraumatic, pupils are equal, round and reactive to light and accommodation. Funduscopic exam is normal with sharp disc margins noted. Extraocular tracking is good without limitation to gaze excursion or nystagmus noted. Normal smooth pursuit is noted. Hearing is mildly impaired. Face is symmetric with normal facial animation and normal facial sensation. Speech is clear with no dysarthria noted. There is no hypophonia. There is no lip, neck/head, jaw or voice tremor. Neck is supple with full range of passive and active motion. There are no carotid bruits on auscultation. Oropharynx exam reveals: mild mouth dryness, adequate dental hygiene with dentures in place and mild airway crowding, due to narrow airway entry. Mallampati is class II. Tongue protrudes centrally and palate elevates symmetrically. Tonsils are 1+.    Chest: Clear to auscultation without wheezing, rhonchi or crackles noted.  Heart: S1+S2+0, regular and normal without murmurs, rubs or gallops noted.   Abdomen: Soft, non-tender and non-distended with normal bowel sounds appreciated on auscultation.  Extremities: There is no pitting edema in the distal lower  extremities bilaterally. Pedal pulses are intact.  Skin: Warm and dry without trophic changes noted. There are no varicose veins.  Musculoskeletal: exam reveals no obvious joint deformities, tenderness or joint swelling or erythema.   Neurologically:  Mental status: The patient is awake, alert and oriented in all 4 spheres. His memory, attention, language and knowledge are appropriate. There is no aphasia, agnosia, apraxia or anomia. Speech is clear with normal prosody and enunciation. Thought process is linear. Mood is congruent and affect is normal.  Cranial nerves are as described above under HEENT exam. In addition, shoulder shrug is normal with equal shoulder height noted. Motor exam: Normal bulk, strength and tone is noted. There is no drift, or rebound. There is an intermittent faster resting tremor, b/l, largely unchanged. There is a bilateral upper extremity postural and action tremor, which is mild to moderate in degree, perhaps slightly better than last time. There tremor frequency is fairly fast and  the amplitude is small.  Romberg is negative. Reflexes are 2+ throughout. Fine motor skills are mildly impaired with respect to finger taps, hand movements, rapid alternating patting, foot taps and foot agility.  Cerebellar testing shows no dysmetria or intention tremor on finger to nose testing. Heel to shin is unremarkable bilaterally. There is no truncal or gait ataxia.  Sensory exam is intact to light touch, pinprick, vibration, temperature sense in the upper and lower extremities.  Gait, station and balance: He stands up with mild difficulty and posture is mildly stooped, probably age-appropriate. He does walk with good stride length in pace but does have reduced arm swing bilaterally. No veering to one side is noted. No leaning to one side is noted. Posture is age-appropriate and stance is narrow based. No problems turning are noted. He turns in 3 steps.        Assessment and Plan:   In  summary, Zian Delair is a very pleasant 78 year old male with an underlying medical history of hypertension, hyperlipidemia, diverticulitis, BPH with elevated PSA, whose history and physical exam are in keeping with essential tremor, affecting both upper extremities. He does have a family history of Parkinson's disease in his mother as well as reduced arm swing bilaterally I do not detect much in the way of parkinsonian features otherwise. He has done fairly well on generic topiramate currently at 50 mg each night. He reports no side effects. His exam is stable and in keeping with upper extremity tremors. Perhaps his action tremor is a little bit better indeed. He reports feeling better with the new medicine. I encouraged him to increase the Topamax gradually. To that end, he will take 25 mg in the morning and 50 at night for 2 weeks, then 50 mg twice daily thereafter. I adjusted his prescription in that regard. I also asked him to continue with Enbrel at this time. He requested a refill on this medication and I provided him with a 90 day prescription for Inderal LA 120 mg once daily. We talked about his blood work which showed a normal TSH.  I answered all his questions today and the patient was in agreement with the above outlined plan. I would like to see the patient back in 6 months, sooner if the need arises and encouraged them to call with any interim questions, concerns, problems or updates and refill requests.

## 2014-01-15 NOTE — Patient Instructions (Addendum)
  Please remember, that any kind of tremor may be exacerbated by anxiety, anger, nervousness, excitement, dehydration, sleep deprivation, by caffeine, and low blood sugar values or blood sugar fluctuations. Some medications, especially some antidepressants and lithium can cause or exacerbate tremors. Tremors may temporarily calm down her subside with the use of a benzodiazepine such as Valium or related medications and with alcohol. Be aware however that drinking alcohol is not an approved treatment or appropriate treatment for tremor control and long-term use of benzodiazepines such as Valium, lorazepam, alprazolam, or clonazepam can cause habit formation, physical and psychological addiction.  We will increase your topiramate to 1 pill in the morning and keep 2 pills at night, and after 2 weeks, take 2 pills in AM and 2 pills at night. Continue the Inderal LA 120 mg daily.

## 2014-02-04 ENCOUNTER — Other Ambulatory Visit (HOSPITAL_BASED_OUTPATIENT_CLINIC_OR_DEPARTMENT_OTHER): Payer: Medicare Other

## 2014-02-04 DIAGNOSIS — D649 Anemia, unspecified: Secondary | ICD-10-CM

## 2014-02-04 LAB — CBC WITH DIFFERENTIAL/PLATELET
BASO%: 0.3 % (ref 0.0–2.0)
BASOS ABS: 0 10*3/uL (ref 0.0–0.1)
EOS ABS: 0.1 10*3/uL (ref 0.0–0.5)
EOS%: 0.4 % (ref 0.0–7.0)
HEMATOCRIT: 35.4 % — AB (ref 38.4–49.9)
HEMOGLOBIN: 11.9 g/dL — AB (ref 13.0–17.1)
LYMPH%: 20.5 % (ref 14.0–49.0)
MCH: 29.8 pg (ref 27.2–33.4)
MCHC: 33.6 g/dL (ref 32.0–36.0)
MCV: 88.7 fL (ref 79.3–98.0)
MONO#: 1.5 10*3/uL — AB (ref 0.1–0.9)
MONO%: 10.2 % (ref 0.0–14.0)
NEUT#: 10 10*3/uL — ABNORMAL HIGH (ref 1.5–6.5)
NEUT%: 68.6 % (ref 39.0–75.0)
Platelets: 311 10*3/uL (ref 140–400)
RBC: 3.99 10*6/uL — ABNORMAL LOW (ref 4.20–5.82)
RDW: 16.5 % — ABNORMAL HIGH (ref 11.0–14.6)
WBC: 14.6 10*3/uL — AB (ref 4.0–10.3)
lymph#: 3 10*3/uL (ref 0.9–3.3)

## 2014-04-01 ENCOUNTER — Other Ambulatory Visit (HOSPITAL_BASED_OUTPATIENT_CLINIC_OR_DEPARTMENT_OTHER): Payer: Medicare Other

## 2014-04-01 ENCOUNTER — Ambulatory Visit (HOSPITAL_BASED_OUTPATIENT_CLINIC_OR_DEPARTMENT_OTHER): Payer: Medicare Other | Admitting: Internal Medicine

## 2014-04-01 ENCOUNTER — Other Ambulatory Visit: Payer: Self-pay | Admitting: Internal Medicine

## 2014-04-01 VITALS — BP 117/71 | HR 55 | Temp 97.8°F | Resp 18 | Ht 65.25 in | Wt 171.8 lb

## 2014-04-01 DIAGNOSIS — D649 Anemia, unspecified: Secondary | ICD-10-CM

## 2014-04-01 DIAGNOSIS — D72829 Elevated white blood cell count, unspecified: Secondary | ICD-10-CM | POA: Diagnosis not present

## 2014-04-01 LAB — CBC WITH DIFFERENTIAL/PLATELET
BASO%: 1 % (ref 0.0–2.0)
Basophils Absolute: 0.1 10e3/uL (ref 0.0–0.1)
EOS%: 1.8 % (ref 0.0–7.0)
Eosinophils Absolute: 0.2 10e3/uL (ref 0.0–0.5)
HCT: 37.7 % — ABNORMAL LOW (ref 38.4–49.9)
HGB: 12.5 g/dL — ABNORMAL LOW (ref 13.0–17.1)
LYMPH%: 23.6 % (ref 14.0–49.0)
MCH: 30 pg (ref 27.2–33.4)
MCHC: 33.1 g/dL (ref 32.0–36.0)
MCV: 90.8 fL (ref 79.3–98.0)
MONO#: 0.6 10e3/uL (ref 0.1–0.9)
MONO%: 5.6 % (ref 0.0–14.0)
NEUT#: 7.8 10e3/uL — ABNORMAL HIGH (ref 1.5–6.5)
NEUT%: 68 % (ref 39.0–75.0)
Platelets: 341 10e3/uL (ref 140–400)
RBC: 4.16 10e6/uL — ABNORMAL LOW (ref 4.20–5.82)
RDW: 14.9 % — ABNORMAL HIGH (ref 11.0–14.6)
WBC: 11.4 10e3/uL — ABNORMAL HIGH (ref 4.0–10.3)
lymph#: 2.7 10e3/uL (ref 0.9–3.3)

## 2014-04-01 NOTE — Progress Notes (Unsigned)
[facility] OFFICE PROGRESS NOTE  Horatio Pel, MD [address] [place] [address]  DIAGNOSIS: Anemia  No chief complaint on file.   INTERVAL HISTORY: Andres Lopez 78 y.o. male who was initially referred by Dr. Shelia Media for management and evaluation of his chronic anemia on 10/10/2013. Today, he is accompanied by his wife Everlene Farrier.   As previously noted, he reports that he has been evaluated for anemia about 5 years ago and had decreased iron stores and he was prescribed iron. He reports a colonoscopy six months ago with 3 polyps removed and instructed to follow-up in 3 years. This was done by Dr. Michail Sermon. He reports feeling tired but he denies dyspnea on exertion or fevers or chills.  He reports starting iron supplementation in December.  He denies any symptoms of anemia such as palpitations, acute shortness or breath or chest pain.  He has not had any emergency room visits or hospitalizations.   MEDICAL HISTORY: Past Medical History  Diagnosis Date  . Hypertension   . Essential tremor 01/15/2014    INTERIM HISTORY: has Dehydration; Hypokalemia; Anemia; HTN (hypertension); Leukocytosis, unspecified; and Essential tremor on his problem list.    ALLERGIES:  is allergic to tramadol.  MEDICATIONS: has a current medication list which includes the following prescription(s): amlodipine, aspirin ec, atorvastatin, ferrous sulfate, losartan-hydrochlorothiazide, propranolol er, and topiramate.  SURGICAL HISTORY:  Past Surgical History  Procedure Laterality Date  . Tumor removal      neck    REVIEW OF SYSTEMS:   Constitutional: Denies fevers, chills or abnormal weight loss Eyes: Denies blurriness of vision Ears, nose, mouth, throat, and face: Denies mucositis or sore throat Respiratory: Denies cough, dyspnea or wheezes Cardiovascular: Denies palpitation, chest discomfort or lower extremity swelling Gastrointestinal:  Denies nausea, heartburn  or change in bowel habits Skin: Denies abnormal skin rashes Lymphatics: Denies new lymphadenopathy or easy bruising Neurological:Denies numbness, tingling or new weaknesses Behavioral/Psych: Mood is stable, no new changes  All other systems were reviewed with the patient and are negative.  PHYSICAL EXAMINATION: ECOG PERFORMANCE STATUS: 0 - Asymptomatic  There were no vitals taken for this visit.  GENERAL:alert, no distress and comfortable; tremors but otherwise gets to table without difficulty. Mildly overweight.  SKIN: skin color, texture, turgor are normal, no rashes or significant lesions EYES: normal, Conjunctiva are pink and non-injected, sclera clear OROPHARYNX:no exudate, no erythema and lips, buccal mucosa, and tongue normal  NECK: supple, thyroid normal size, non-tender, without nodularity LYMPH:  no palpable lymphadenopathy in the cervical, axillary or supraclavicular LUNGS: clear to auscultation with normal breathing effort, no wheezes or rhonchi HEART: regular rate & rhythm and no murmurs and no lower extremity edema ABDOMEN:abdomen soft, non-tender and normal bowel sounds Musculoskeletal:no cyanosis of digits and no clubbing  NEURO: alert & oriented x 3 with fluent speech, no focal motor/sensory deficits  Labs:  Lab Results  Component Value Date   WBC 11.4* 04/01/2014   HGB 12.5* 04/01/2014   HCT 37.7* 04/01/2014   MCV 90.8 04/01/2014   PLT 341 04/01/2014   NEUTROABS 7.8* 04/01/2014      Chemistry      Component Value Date/Time   NA 143 10/10/2013 1319   NA 139 09/22/2013 0605   K 3.3* 10/10/2013 1319   K 4.0 09/22/2013 0605   CL 109 09/22/2013 0605   CO2 25 10/10/2013 1319   CO2 22 09/22/2013 0605   BUN 16.0 10/10/2013 1319   BUN 22 09/22/2013 5009  CREATININE 1.2 10/10/2013 1319   CREATININE 1.21 09/22/2013 0605      Component Value Date/Time   CALCIUM 9.4 10/10/2013 1319   CALCIUM 8.2* 09/22/2013 0605   ALKPHOS 110 10/10/2013 1319   ALKPHOS 93 05/08/2007 0027    AST 14 10/10/2013 1319   AST 19 05/08/2007 0027   ALT 10 10/10/2013 1319   ALT 18 05/08/2007 0027   BILITOT 0.49 10/10/2013 1319   BILITOT 0.7 05/08/2007 0027     Results for Andres, Lopez (MRN [id number]) as of 12/10/2013 18:57  Ref. Range 09/22/2013 06:05 10/10/2013 13:19 12/10/2013 12:35  Hemoglobin Latest Range: 13.0-17.0 g/dL 9.1 (L) 10.8 (L) 11.6 (L)   Results for Andres, Lopez (MRN [id number]) as of 12/10/2013 18:57  Ref. Range 09/22/2013 06:05 10/10/2013 13:19 12/10/2013 12:35  WBC Latest Range: 4.0-10.5 K/uL 10.9 (H) 13.1 (H) 11.8 (H)   GFR Estimated Creatinine Clearance: 51.5 ml/min (by C-G formula based on Cr of 1.2).  CBC:  Recent Labs Lab 04/01/14 1154  WBC 11.4*  NEUTROABS 7.8*  HGB 12.5*  HCT 37.7*  MCV 90.8  PLT 341    Anemia work up No results found for this basename: VITAMINB12, FOLATE, FERRITIN, TIBC, IRON, RETICCTPCT,  in the last 72 hours  Studies:  No results found.   RADIOGRAPHIC STUDIES: No results found.  ASSESSMENT: Andres Lopez 78 y.o. male with a history of Anemia   PLAN:   1. Chronic Anemia NOS.  --Today his hemoglobin has improved from 9.1 in 09/22/2013 to 10.8 last visit to 11.6 today.  His MCV is 87.8 (normocytic).  His RDW is 16.1. His vitamin B12 was 232 on 09/21/2013; folate was 8.4.  --His hemoglobin appears to have improved since starting iron although his ferritin points to anemia of chronic disease. He could also have a mild concomitant iron deficiency. I would continue a trial of iron supplementation and repeat hemoglobin and iron indices in 2 months.  --GI work-up is fairly recent making a GI bleeding source less likely. Still he does have a documented history of diverticulosis seen on colonoscopy done on 12/19/2006 due to hemoccult positivity.   2. Leukocytosis, chronic.  --Predominate neutrophils. Patient denies stressors or being on steroids.  WBC are not increasing.  We will closely observe and consider sending for  BCR-ABL if persistent.   If anemia worsens in light of this, we will obtain a bone marrow biopsy to rule out myeloproliferative disorder.     3. Follow-up.  --CBC in 2 months and follow up in 4 months with CBC and symptom visit.   All questions were answered. The patient knows to call the clinic with any problems, questions or concerns. We can certainly see the patient much sooner if necessary.  I spent 10 minutes counseling the patient face to face. The total time spent in the appointment was 15 minutes.    Concha Norway, MD 04/01/2014 12:07 PM

## 2014-04-01 NOTE — Progress Notes (Signed)
[facility] OFFICE PROGRESS NOTE  Horatio Pel, MD [address] [place] [address]  DIAGNOSIS: Anemia  Chief Complaint  Patient presents with  . Anemia    INTERVAL HISTORY: Andres Lopez 78 y.o. male who was initially referred by Dr. Shelia Media for management and evaluation of his chronic anemia on 12/10/2013. Today, he is accompanied by his wife Andres Lopez.   As previously noted, he reports that he has been evaluated for anemia about 5 years ago and had decreased iron stores and he was prescribed iron. He reports a colonoscopy six months ago with 3 polyps removed and instructed to follow-up in 3 years. This was done by Dr. Michail Sermon. He reports feeling less fatigued and he thinks that he is doing more than he once did.  He reports starting iron supplementation in December 2014.  He denies any symptoms of anemia such as palpitations, acute shortness or breath or chest pain.  He has not had any emergency room visits or hospitalizations.   MEDICAL HISTORY: Past Medical History  Diagnosis Date  . Hypertension   . Essential tremor 01/15/2014    INTERIM HISTORY: has Dehydration; Hypokalemia; Anemia; HTN (hypertension); Leukocytosis, unspecified; and Essential tremor on his problem list.    ALLERGIES:  is allergic to tramadol.  MEDICATIONS: has a current medication list which includes the following prescription(s): amlodipine, aspirin ec, atorvastatin, ferrous sulfate, losartan-hydrochlorothiazide, propranolol er, and topiramate.  SURGICAL HISTORY:  Past Surgical History  Procedure Laterality Date  . Tumor removal      neck    REVIEW OF SYSTEMS:   Constitutional: Denies fevers, chills or abnormal weight loss Eyes: Denies blurriness of vision Ears, nose, mouth, throat, and face: Denies mucositis or sore throat Respiratory: Denies cough, dyspnea or wheezes Cardiovascular: Denies palpitation, chest discomfort or lower extremity  swelling Gastrointestinal:  Denies nausea, heartburn or change in bowel habits Skin: Denies abnormal skin rashes Lymphatics: Denies new lymphadenopathy or easy bruising Neurological:Denies numbness, tingling or new weaknesses Behavioral/Psych: Mood is stable, no new changes  All other systems were reviewed with the patient and are negative.  PHYSICAL EXAMINATION: ECOG PERFORMANCE STATUS: 0 - Asymptomatic  Blood pressure 117/71, pulse 55, temperature 97.8 F (36.6 C), temperature source Oral, resp. rate 18, height 5' 5.25" (1.657 m), weight 171 lb 12.8 oz (77.928 kg), SpO2 100.00%.  GENERAL:alert, no distress and comfortable; tremors but otherwise gets to table without difficulty. Mildly overweight.  SKIN: skin color, texture, turgor are normal, no rashes or significant lesions EYES: normal, Conjunctiva are pink and non-injected, sclera clear OROPHARYNX:no exudate, no erythema and lips, buccal mucosa, and tongue normal  NECK: supple, thyroid normal size, non-tender, without nodularity LYMPH:  no palpable lymphadenopathy in the cervical, axillary or supraclavicular LUNGS: clear to auscultation with normal breathing effort, no wheezes or rhonchi HEART: regular rate & rhythm and no murmurs and no lower extremity edema ABDOMEN:abdomen soft, non-tender and normal bowel sounds Musculoskeletal:no cyanosis of digits and no clubbing  NEURO: alert & oriented x 3 with fluent speech, no focal motor/sensory deficits  Labs:  Lab Results  Component Value Date   WBC 11.4* 04/01/2014   HGB 12.5* 04/01/2014   HCT 37.7* 04/01/2014   MCV 90.8 04/01/2014   PLT 341 04/01/2014   NEUTROABS 7.8* 04/01/2014      Chemistry      Component Value Date/Time   NA 143 10/10/2013 1319   NA 139 09/22/2013 0605   K 3.3* 10/10/2013 1319   K 4.0 09/22/2013 8916  CL 109 09/22/2013 0605   CO2 25 10/10/2013 1319   CO2 22 09/22/2013 0605   BUN 16.0 10/10/2013 1319   BUN 22 09/22/2013 0605   CREATININE 1.2 10/10/2013 1319    CREATININE 1.21 09/22/2013 0605      Component Value Date/Time   CALCIUM 9.4 10/10/2013 1319   CALCIUM 8.2* 09/22/2013 0605   ALKPHOS 110 10/10/2013 1319   ALKPHOS 93 05/08/2007 0027   AST 14 10/10/2013 1319   AST 19 05/08/2007 0027   ALT 10 10/10/2013 1319   ALT 18 05/08/2007 0027   BILITOT 0.49 10/10/2013 1319   BILITOT 0.7 05/08/2007 0027      GFR Estimated Creatinine Clearance: 51.5 ml/min (by C-G formula based on Cr of 1.2).  CBC:  Recent Labs Lab 04/01/14 1154  WBC 11.4*  NEUTROABS 7.8*  HGB 12.5*  HCT 37.7*  MCV 90.8  PLT 341   Studies:  No results found.   RADIOGRAPHIC STUDIES: No results found.  ASSESSMENT: Andres Lopez 78 y.o. male with a history of Anemia   PLAN:   1. Chronic Anemia NOS.  --Today his hemoglobin has improved from 9.1 in 09/22/2013 to 10.8  to 11.6 to 12.5 today.  His MCV is normocytic.  His RDW is 14.9. His vitamin B12 was 232 on 09/21/2013; folate was 8.4.  --His hemoglobin appears to have improved since starting iron although his ferritin points to anemia of chronic disease. He could also have a mild concomitant iron deficiency. I would continue a trial of iron supplementation and decrease to once a day. --GI work-up is fairly recent making a GI bleeding source less likely. Still he does have a documented history of diverticulosis seen on colonoscopy done on 12/19/2006 due to hemoccult positivity.   2. Leukocytosis, chronic.  --Predominate neutrophils. Patient denies stressors or being on steroids.  WBC are not increasing.  We will closely observe and consider sending for BCR-ABL if persistent.   If anemia worsens in light of this, we will obtain a bone marrow biopsy to rule out myeloproliferative disorder.     3. Follow-up.  --Follow up in 4 months with CBC and symptom visit.   All questions were answered. The patient knows to call the clinic with any problems, questions or concerns. We can certainly see the patient much sooner if  necessary.  I spent 10 minutes counseling the patient face to face. The total time spent in the appointment was 15 minutes.    Concha Norway, MD 04/01/2014 12:19 PM

## 2014-04-05 ENCOUNTER — Telehealth: Payer: Self-pay | Admitting: Internal Medicine

## 2014-04-05 NOTE — Telephone Encounter (Signed)
s/w pt re appt for 10/8

## 2014-05-21 DIAGNOSIS — N390 Urinary tract infection, site not specified: Secondary | ICD-10-CM | POA: Diagnosis not present

## 2014-05-21 DIAGNOSIS — D631 Anemia in chronic kidney disease: Secondary | ICD-10-CM | POA: Diagnosis not present

## 2014-05-21 DIAGNOSIS — N039 Chronic nephritic syndrome with unspecified morphologic changes: Secondary | ICD-10-CM | POA: Diagnosis not present

## 2014-05-21 DIAGNOSIS — N2581 Secondary hyperparathyroidism of renal origin: Secondary | ICD-10-CM | POA: Diagnosis not present

## 2014-05-21 DIAGNOSIS — I129 Hypertensive chronic kidney disease with stage 1 through stage 4 chronic kidney disease, or unspecified chronic kidney disease: Secondary | ICD-10-CM | POA: Diagnosis not present

## 2014-05-21 DIAGNOSIS — N183 Chronic kidney disease, stage 3 unspecified: Secondary | ICD-10-CM | POA: Diagnosis not present

## 2014-07-18 ENCOUNTER — Ambulatory Visit: Payer: Medicare Other | Admitting: Neurology

## 2014-07-19 ENCOUNTER — Encounter (INDEPENDENT_AMBULATORY_CARE_PROVIDER_SITE_OTHER): Payer: Self-pay

## 2014-07-19 ENCOUNTER — Encounter: Payer: Self-pay | Admitting: Neurology

## 2014-07-19 ENCOUNTER — Ambulatory Visit (INDEPENDENT_AMBULATORY_CARE_PROVIDER_SITE_OTHER): Payer: Medicare Other | Admitting: Neurology

## 2014-07-19 VITALS — BP 111/63 | HR 57 | Temp 98.2°F | Ht 60.0 in | Wt 168.0 lb

## 2014-07-19 DIAGNOSIS — Z820 Family history of epilepsy and other diseases of the nervous system: Secondary | ICD-10-CM | POA: Diagnosis not present

## 2014-07-19 DIAGNOSIS — G250 Essential tremor: Secondary | ICD-10-CM

## 2014-07-19 DIAGNOSIS — G252 Other specified forms of tremor: Secondary | ICD-10-CM

## 2014-07-19 NOTE — Progress Notes (Signed)
Subjective:    Patient ID: Andres Lopez is a 78 y.o. male.  HPI    Interim history:   Andres Lopez is a very pleasant 78 year old right-handed gentleman with an underlying medical history of hypertension, hyperlipidemia, diverticulitis, BPH with elevated PSA and chronic anemia, who presents for followup consultation of his essential tremor. He is unaccompanied today. I last saw him on 01/15/2014, at which time he reported that his tremor was better and he was tolerating Topamax, 25 mg strength 2 at night. He reported no other changes in his medications or medical history but had an episode of sinus congestion for which he had taken over-the-counter medication. I suggested that he gradually increase the Topamax to 50 mg twice daily in 2 increments. I suggested he continue with Inderal LA 120 mg once daily. Today, he reports, that he feels about the same. Some days are better than others and symptoms are worse, when he is a crowd. He has not necessarily and is tolerating the medication, but may have some excess sleepiness. He drives and has had no issues with his driving skills.  I first met him on 10/09/2013, at which time I felt he most likely had essential tremor. He does have a family history of Parkinson's disease. He was tried on primidone in the past and had side effects as well as on a beta blocker which stopped working. I suggested a trial of Trokendi XR, but the patient's insurance did not cover it and he could not afford it. I switched him to generic Topamax. I checked a TSH, which was normal.  He has had a chronic upper extremity tremor for the past 6 years. He was treated with primidone some 3 years ago, by Dr. Justine Null in [facility] and has been seeing him for the past 3 years. Primidone caused side effects, including depression and personality changes per wife. He has been on Inderal LA, which helped in the beginning, but then stopped working. He was on it for about 2 years, also through  Dr. Justine Null. He had not seen him in years. His mother had a hand and head tremor and a diagnosis of parkinson's disease. Looking back, he recalls, he had a intermittent hand tremor when he was in his 20s, but it improved or stabilized. He does not drink alcohol. He does not have a head or voice tremor, and no other family member with tremor. He denies balance problems and has not fallen. He has a more tremulous handwriting. He does not have glaucoma or kidney stones and does have some hearing loss. He had noise exposure at work. He worked at [employer] for 35 years. He quit smoking in '77.     His Past Medical History Is Significant For: Past Medical History  Diagnosis Date  . Hypertension   . Essential tremor 01/15/2014    His Past Surgical History Is Significant For: Past Surgical History  Procedure Laterality Date  . Tumor removal      neck    His Family History Is Significant For: Family History  Problem Relation Age of Onset  . Parkinsonism Mother     His Social History Is Significant For: History   Social History  . Marital Status: Married    Spouse Name: Andres Lopez    Number of Children: 3  . Years of Education: 12   Occupational History  .      retired   Social History Main Topics  . Smoking status: Former Research scientist (life sciences)  . Smokeless  tobacco: Never Used  . Alcohol Use: No  . Drug Use: No  . Sexual Activity: No   Other Topics Concern  . None   Social History Narrative   Patient is right handed,resides with wife    His Allergies Are:  Allergies  Allergen Reactions  . Tramadol Other (See Comments)    "makes him crazy"  :   His Current Medications Are:  Outpatient Encounter Prescriptions as of 07/19/2014  Medication Sig  . amLODipine (NORVASC) 5 MG tablet Take 5 mg by mouth daily.  Marland Kitchen aspirin EC 81 MG tablet Take 81 mg by mouth daily at 6 PM.  . atorvastatin (LIPITOR) 80 MG tablet Take 80 mg by mouth daily at 6 PM.  . ferrous sulfate 325 (65 FE) MG tablet  Take 325 mg by mouth daily with breakfast. One tablet by mouth daily  . losartan-hydrochlorothiazide (HYZAAR) 100-25 MG per tablet Take 1 tablet by mouth daily. 1/2 tablet daily  . propranolol ER (INDERAL LA) 120 MG 24 hr capsule Take 1 capsule (120 mg total) by mouth daily.  Marland Kitchen topiramate (TOPAMAX) 25 MG tablet 1 pill each AM and 2 each night x 2 weeks, then 2 pill each AM and 2 pills each night thereafter.  . [DISCONTINUED] ferrous sulfate 325 (65 FE) MG tablet Take 1 tablet (325 mg total) by mouth 2 (two) times daily with a meal.  :  Review of Systems:  Out of a complete 14 point review of systems, all are reviewed and negative with the exception of these symptoms as listed below:   Review of Systems  HENT: Positive for hearing loss.   Musculoskeletal:       Joint pain  Skin: Positive for rash.    Objective:  Neurologic Exam  Physical Exam Physical Examination:   Filed Vitals:   07/19/14 1110  BP: 111/63  Pulse: 57  Temp: 98.2 F (36.8 C)    General Examination: The patient is a very pleasant 78 y.o. male in no acute distress. He appears well-developed and well-nourished and well groomed.   HEENT: Normocephalic, atraumatic, pupils are equal, round and reactive to light and accommodation. Funduscopic exam is normal with sharp disc margins noted. Extraocular tracking is good without limitation to gaze excursion or nystagmus noted. Normal smooth pursuit is noted. Hearing is impaired. Face is symmetric with normal facial animation and normal facial sensation. Speech is clear with no dysarthria noted. There is no hypophonia. There is no lip, neck/head, jaw or voice tremor. Neck is supple with full range of passive and active motion. There are no carotid bruits on auscultation. Oropharynx exam reveals: mild mouth dryness, adequate dental hygiene with dentures in place and mild airway crowding, due to narrow airway entry. Mallampati is class II. Tongue protrudes centrally and palate  elevates symmetrically. Tonsils are 1+.    Chest: Clear to auscultation without wheezing, rhonchi or crackles noted.  Heart: S1+S2+0, regular and normal without murmurs, rubs or gallops noted.   Abdomen: Soft, non-tender and non-distended with normal bowel sounds appreciated on auscultation.  Extremities: There is no pitting edema in the distal lower extremities bilaterally. Pedal pulses are intact.  Skin: Warm and dry without trophic changes noted. There are no varicose veins.  Musculoskeletal: exam reveals no obvious joint deformities, tenderness or joint swelling or erythema.   Neurologically:  Mental status: The patient is awake, alert and oriented in all 4 spheres. His memory, attention, language and knowledge are appropriate. There is no aphasia, agnosia, apraxia or  anomia. Speech is clear with normal prosody and enunciation. Thought process is linear. Mood is congruent and affect is normal.  Cranial nerves are as described above under HEENT exam. In addition, shoulder shrug is normal with equal shoulder height noted. Motor exam: Normal bulk, strength and tone is noted. There is no drift, or rebound. There is an intermittent faster resting tremor, b/l, largely unchanged, R more than L. There is a bilateral upper extremity postural and action tremor, which is mild to moderate in degree, stable. There tremor frequency is fairly fast and the amplitude is small.  Romberg is negative. Reflexes are 2+ throughout. Fine motor skills are mildly impaired with respect to finger taps, hand movements, rapid alternating patting, foot taps and foot agility.  Cerebellar testing shows no dysmetria or intention tremor on finger to nose testing. Heel to shin is unremarkable bilaterally. There is no truncal or gait ataxia.  Sensory exam is intact to light touch, pinprick, vibration, temperature sense in the upper and lower extremities.  Gait, station and balance: He stands up with mild difficulty and posture  is mildly stooped, probably age-appropriate. He does walk with good stride length and pace but does have reduced arm swing bilaterally, stable. No veering to one side is noted. No leaning to one side is noted. Posture is age-appropriate and stance is narrow based. No problems turning are noted. He turns in 3 steps.        Assessment and Plan:   In summary, Andres Lopez is a very pleasant 78 year old male with an underlying medical history of hypertension, hyperlipidemia, diverticulitis, BPH with elevated PSA, whose history and physical exam are in keeping with essential tremor, affecting both upper extremities with a resting tremor, R more than L. He does have a family history of Parkinson's disease in his mother as well as reduced arm swing bilaterally, but overall stable findings and I do not detect much in the way of parkinsonian features otherwise. He has done fairly well on generic topiramate currently at 50 mg bid. He reports no side effects. His exam is stable and in keeping with upper extremity tremors. I also asked him to continue with Inderal LA 120 mg once daily. I answered all his questions today and the patient was in agreement with the above outlined plan. I would like to see the patient back in 6 months, sooner if the need arises and encouraged them to call with any interim questions, concerns, problems or updates and refill requests.

## 2014-07-19 NOTE — Patient Instructions (Signed)
  Please remember, that any kind of tremor may be exacerbated by anxiety, anger, nervousness, excitement, dehydration, sleep deprivation, by caffeine, and low blood sugar values or blood sugar fluctuations. Some medications, especially some antidepressants and lithium can cause or exacerbate tremors. Tremors may temporarily calm down her subside with the use of a benzodiazepine such as Valium or related medications and with alcohol. Be aware however that drinking alcohol is not an approved treatment or appropriate treatment for tremor control and long-term use of benzodiazepines such as Valium, lorazepam, alprazolam, or clonazepam can cause habit formation, physical and psychological addiction.  We will continue with the same medications.   Follow up in 6 months.

## 2014-08-01 ENCOUNTER — Other Ambulatory Visit (HOSPITAL_BASED_OUTPATIENT_CLINIC_OR_DEPARTMENT_OTHER): Payer: Medicare Other

## 2014-08-01 ENCOUNTER — Ambulatory Visit (HOSPITAL_BASED_OUTPATIENT_CLINIC_OR_DEPARTMENT_OTHER): Payer: Medicare Other | Admitting: Hematology

## 2014-08-01 ENCOUNTER — Encounter: Payer: Self-pay | Admitting: Hematology

## 2014-08-01 ENCOUNTER — Telehealth: Payer: Self-pay | Admitting: Hematology

## 2014-08-01 VITALS — BP 116/76 | HR 57 | Temp 98.8°F | Resp 17 | Ht 60.0 in | Wt 165.7 lb

## 2014-08-01 DIAGNOSIS — D649 Anemia, unspecified: Secondary | ICD-10-CM

## 2014-08-01 DIAGNOSIS — D509 Iron deficiency anemia, unspecified: Secondary | ICD-10-CM | POA: Diagnosis not present

## 2014-08-01 DIAGNOSIS — D72828 Other elevated white blood cell count: Secondary | ICD-10-CM | POA: Diagnosis not present

## 2014-08-01 LAB — CBC WITH DIFFERENTIAL/PLATELET
BASO%: 0.7 % (ref 0.0–2.0)
Basophils Absolute: 0.1 10*3/uL (ref 0.0–0.1)
EOS%: 1.6 % (ref 0.0–7.0)
Eosinophils Absolute: 0.2 10*3/uL (ref 0.0–0.5)
HCT: 37.3 % — ABNORMAL LOW (ref 38.4–49.9)
HEMOGLOBIN: 12 g/dL — AB (ref 13.0–17.1)
LYMPH%: 28.9 % (ref 14.0–49.0)
MCH: 29.4 pg (ref 27.2–33.4)
MCHC: 32.3 g/dL (ref 32.0–36.0)
MCV: 91 fL (ref 79.3–98.0)
MONO#: 0.9 10*3/uL (ref 0.1–0.9)
MONO%: 8.6 % (ref 0.0–14.0)
NEUT#: 6 10*3/uL (ref 1.5–6.5)
NEUT%: 60.2 % (ref 39.0–75.0)
Platelets: 346 10*3/uL (ref 140–400)
RBC: 4.09 10*6/uL — ABNORMAL LOW (ref 4.20–5.82)
RDW: 15.5 % — AB (ref 11.0–14.6)
WBC: 10 10*3/uL (ref 4.0–10.3)
lymph#: 2.9 10*3/uL (ref 0.9–3.3)

## 2014-08-01 LAB — COMPREHENSIVE METABOLIC PANEL (CC13)
ALBUMIN: 3.2 g/dL — AB (ref 3.5–5.0)
ALK PHOS: 92 U/L (ref 40–150)
ALT: 18 U/L (ref 0–55)
AST: 13 U/L (ref 5–34)
Anion Gap: 7 mEq/L (ref 3–11)
BILIRUBIN TOTAL: 0.29 mg/dL (ref 0.20–1.20)
BUN: 18.5 mg/dL (ref 7.0–26.0)
CO2: 25 mEq/L (ref 22–29)
Calcium: 9.6 mg/dL (ref 8.4–10.4)
Chloride: 110 mEq/L — ABNORMAL HIGH (ref 98–109)
Creatinine: 1.3 mg/dL (ref 0.7–1.3)
Glucose: 71 mg/dl (ref 70–140)
POTASSIUM: 4.2 meq/L (ref 3.5–5.1)
Sodium: 142 mEq/L (ref 136–145)
Total Protein: 6.9 g/dL (ref 6.4–8.3)

## 2014-08-01 LAB — LACTATE DEHYDROGENASE (CC13): LDH: 128 U/L (ref 125–245)

## 2014-08-01 NOTE — Progress Notes (Signed)
Patient refusing flu shot, wife and patient to get at PCP.

## 2014-08-01 NOTE — Telephone Encounter (Signed)
gv pt appt schedule for april 2016.  °

## 2014-08-01 NOTE — Progress Notes (Signed)
Andres Lopez PROGRESS NOTE Date of Visit: 08/02/2015  Andres Pel, MD Andres Lopez [id number]  DIAGNOSIS: Iron deficiency anemia - Plan: CBC with Differential, Comprehensive metabolic panel (Cmet) - [facility], Iron and TIBC [facility], Ferritin   INTERVAL HISTORY:  Andres Lopez 78 y.o. male who was initially referred by Dr. Shelia Media for management and evaluation of his chronic anemia on 12/10/2013. Today, he is accompanied by his wife Andres Lopez. He was last seen by Dr Elson Areas on 04/01/2014.  As previously noted, he reports that he has been evaluated for anemia about 5 years ago and had decreased iron stores and he was prescribed iron. He reports a colonoscopy in April where 3 polyps removed and instructed to follow-up in 3 years. This was done by Dr. Michail Sermon. He reports feeling less fatigued and he thinks that he is doing more than he once did.  He reports starting iron supplementation in December 2014.  He denies any symptoms of anemia such as palpitations, acute shortness or breath or chest pain.  He has not had any emergency room visits or hospitalizations. He takes iron once a day. His energy level is good. He follows with neurology for his diagnosis of Essential Tremors.  MEDICAL HISTORY: Past Medical History  Diagnosis Date  . Hypertension   . Essential tremor 01/15/2014    INTERIM HISTORY: has Dehydration; Hypokalemia; Anemia; HTN (hypertension); Leukocytosis, unspecified; and Essential tremor on his problem list.    ALLERGIES:  is allergic to tramadol.  MEDICATIONS: has a current medication list which includes the following prescription(s): amlodipine, aspirin ec, atorvastatin, ferrous sulfate, losartan-hydrochlorothiazide, propranolol er, and topiramate.  SURGICAL HISTORY:  Past Surgical History  Procedure Laterality Date  . Tumor removal      neck    REVIEW OF SYSTEMS:   Constitutional: Denies fevers, chills or  abnormal weight loss Eyes: Denies blurriness of vision Ears, nose, mouth, throat, and face: Denies mucositis or sore throat Respiratory: Denies cough, dyspnea or wheezes Cardiovascular: Denies palpitation, chest discomfort or lower extremity swelling Gastrointestinal:  Denies nausea, heartburn or change in bowel habits Skin: Denies abnormal skin rashes Lymphatics: Denies new lymphadenopathy or easy bruising Neurological:Denies numbness, tingling or new weaknesses Behavioral/Psych: Mood is stable, no new changes  All other systems were reviewed with the patient and are negative.  PHYSICAL EXAMINATION: ECOG PERFORMANCE STATUS: 0  Blood pressure 116/76, pulse 57, temperature 98.8 F (37.1 C), temperature source Oral, resp. rate 17, height 5' (1.524 m), weight 165 lb 11.2 oz (75.161 kg), SpO2 98.00%.  GENERAL:alert, no distress and comfortable; tremors but otherwise gets to table without difficulty. Mildly overweight.  SKIN: skin color, texture, turgor are normal, no rashes or significant lesions EYES: normal, Conjunctiva are pink and non-injected, sclera clear OROPHARYNX:no exudate, no erythema and lips, buccal mucosa, and tongue normal  NECK: supple, thyroid normal size, non-tender, without nodularity LYMPH:  no palpable lymphadenopathy in the cervical, axillary or supraclavicular LUNGS: clear to auscultation with normal breathing effort, no wheezes or rhonchi HEART: regular rate & rhythm and no murmurs and no lower extremity edema ABDOMEN:abdomen soft, non-tender and normal bowel sounds Musculoskeletal:no cyanosis of digits and no clubbing  NEURO: alert & oriented x 3 with fluent speech, no focal motor/sensory deficits  Labs:  Lab Results  Component Value Date   WBC 10.0 08/01/2014   HGB 12.0* 08/01/2014   HCT 37.3* 08/01/2014   MCV 91.0 08/01/2014   PLT 346 08/01/2014   NEUTROABS 6.0 08/01/2014  Chemistry      Component Value Date/Time   NA 142 08/01/2014 1303   NA 139  09/22/2013 0605   K 4.2 08/01/2014 1303   K 4.0 09/22/2013 0605   CL 109 09/22/2013 0605   CO2 25 08/01/2014 1303   CO2 22 09/22/2013 0605   BUN 18.5 08/01/2014 1303   BUN 22 09/22/2013 0605   CREATININE 1.3 08/01/2014 1303   CREATININE 1.21 09/22/2013 0605      Component Value Date/Time   CALCIUM 9.6 08/01/2014 1303   CALCIUM 8.2* 09/22/2013 0605   ALKPHOS 92 08/01/2014 1303   ALKPHOS 93 05/08/2007 0027   AST 13 08/01/2014 1303   AST 19 05/08/2007 0027   ALT 18 08/01/2014 1303   ALT 18 05/08/2007 0027   BILITOT 0.29 08/01/2014 1303   BILITOT 0.7 05/08/2007 0027       RADIOGRAPHIC STUDIES:  CXR 2 VIEW:09/10/2013  IMPRESSION:  No active cardiopulmonary disease.   ASSESSMENT: Andres Lopez 78 y.o. male with a history of Iron deficiency anemia - Plan: CBC with Differential, Comprehensive metabolic panel (Cmet) - [facility], Iron and TIBC [facility], Ferritin   PLAN:   1. Chronic Anemia NOS.  --Today his hemoglobin has improved from 9.1 in 09/22/2013 to 10.8  to 11.6 to 12.5 to 12.0 today.  His MCV is normocytic.  His RDW is 15.0. His vitamin B12 was 232 on 09/21/2013; folate was 8.4.  --His hemoglobin appears to have improved since starting iron although his ferritin points to anemia of chronic disease. He could also have a mild concomitant iron deficiency. I would continue a trial of iron supplementation and continue at once a day. --GI work-up is fairly recent making a GI bleeding source less likely. Still he does have a documented history of diverticulosis seen on colonoscopy done on 12/19/2006 due to hemoccult positivity.   2. Leukocytosis, chronic.  -- today for the first time, the white count is normal. Patient denies stressors or being on steroids.  WBC are not increasing.   If anemia worsens in light of this, we will obtain a bone marrow biopsy to rule out myeloproliferative disorder.     3. Follow-up.  --Follow up in 6 months with CBC, CMET,  iron studies and ferritin and symptom  visit.   All questions were answered. The patient knows to call the clinic with any problems, questions or concerns. We can certainly see the patient much sooner if necessary.  I spent 15 minutes counseling the patient face to face. The total time spent in the appointment was 20 minutes.    Bernadene Bell, MD Medical Hematologist/Oncologist [place] Pager: [phone] Lopez No: [phone]

## 2014-08-28 DIAGNOSIS — Z23 Encounter for immunization: Secondary | ICD-10-CM | POA: Diagnosis not present

## 2014-09-17 DIAGNOSIS — M7552 Bursitis of left shoulder: Secondary | ICD-10-CM | POA: Diagnosis not present

## 2014-09-17 DIAGNOSIS — M25512 Pain in left shoulder: Secondary | ICD-10-CM | POA: Diagnosis not present

## 2014-09-17 DIAGNOSIS — M19012 Primary osteoarthritis, left shoulder: Secondary | ICD-10-CM | POA: Diagnosis not present

## 2014-10-02 DIAGNOSIS — N2581 Secondary hyperparathyroidism of renal origin: Secondary | ICD-10-CM | POA: Diagnosis not present

## 2014-10-02 DIAGNOSIS — I129 Hypertensive chronic kidney disease with stage 1 through stage 4 chronic kidney disease, or unspecified chronic kidney disease: Secondary | ICD-10-CM | POA: Diagnosis not present

## 2014-10-02 DIAGNOSIS — D631 Anemia in chronic kidney disease: Secondary | ICD-10-CM | POA: Diagnosis not present

## 2014-10-02 DIAGNOSIS — N183 Chronic kidney disease, stage 3 (moderate): Secondary | ICD-10-CM | POA: Diagnosis not present

## 2014-10-07 DIAGNOSIS — M7580 Other shoulder lesions, unspecified shoulder: Secondary | ICD-10-CM | POA: Diagnosis not present

## 2014-10-07 DIAGNOSIS — D7289 Other specified disorders of white blood cells: Secondary | ICD-10-CM | POA: Diagnosis not present

## 2014-11-07 DIAGNOSIS — Z0000 Encounter for general adult medical examination without abnormal findings: Secondary | ICD-10-CM | POA: Diagnosis not present

## 2014-11-07 DIAGNOSIS — E780 Pure hypercholesterolemia: Secondary | ICD-10-CM | POA: Diagnosis not present

## 2014-11-07 DIAGNOSIS — Z23 Encounter for immunization: Secondary | ICD-10-CM | POA: Diagnosis not present

## 2014-11-07 DIAGNOSIS — D649 Anemia, unspecified: Secondary | ICD-10-CM | POA: Diagnosis not present

## 2014-11-07 DIAGNOSIS — I10 Essential (primary) hypertension: Secondary | ICD-10-CM | POA: Diagnosis not present

## 2014-11-13 DIAGNOSIS — E780 Pure hypercholesterolemia: Secondary | ICD-10-CM | POA: Diagnosis not present

## 2014-11-13 DIAGNOSIS — Z7982 Long term (current) use of aspirin: Secondary | ICD-10-CM | POA: Diagnosis not present

## 2014-11-13 DIAGNOSIS — B351 Tinea unguium: Secondary | ICD-10-CM | POA: Diagnosis not present

## 2014-11-13 DIAGNOSIS — N401 Enlarged prostate with lower urinary tract symptoms: Secondary | ICD-10-CM | POA: Diagnosis not present

## 2014-12-18 ENCOUNTER — Telehealth: Payer: Self-pay | Admitting: Hematology

## 2014-12-18 NOTE — Telephone Encounter (Signed)
S/w pt and he is aware of his appt with dr Suzanne Boron

## 2015-01-17 ENCOUNTER — Ambulatory Visit (INDEPENDENT_AMBULATORY_CARE_PROVIDER_SITE_OTHER): Payer: Medicare Other | Admitting: Neurology

## 2015-01-17 ENCOUNTER — Encounter: Payer: Self-pay | Admitting: Neurology

## 2015-01-17 VITALS — BP 121/75 | HR 68 | Temp 97.9°F | Resp 16 | Ht 65.0 in | Wt 175.0 lb

## 2015-01-17 DIAGNOSIS — G250 Essential tremor: Secondary | ICD-10-CM

## 2015-01-17 DIAGNOSIS — Z820 Family history of epilepsy and other diseases of the nervous system: Secondary | ICD-10-CM | POA: Diagnosis not present

## 2015-01-17 DIAGNOSIS — R0789 Other chest pain: Secondary | ICD-10-CM | POA: Diagnosis not present

## 2015-01-17 MED ORDER — TOPIRAMATE 50 MG PO TABS: 50.0000 mg | ORAL_TABLET | Freq: Two times a day (BID) | ORAL | Status: DC

## 2015-01-17 MED ORDER — PROPRANOLOL HCL ER 120 MG PO CP24: 120.0000 mg | ORAL_CAPSULE | Freq: Every day | ORAL | Status: DC

## 2015-01-17 NOTE — Progress Notes (Signed)
Subjective:    Patient ID: Andres Lopez is a 79 y.o. male.  HPI     Andres Lopez is a very pleasant 79 year old right-handed gentleman with an underlying medical history of hypertension, hyperlipidemia, diverticulitis, BPH with elevated PSA and chronic anemia, who presents for followup consultation of his essential tremor. He is unaccompanied today. I last saw him on 07/19/2014, at which time he felt stable. He had some day-to-day fluctuation of his tremors. I suggested he continue with the same medications, Topamax 50 mg twice daily and Inderal LA 120 mg once daily.  Today, 01/17/2015: The patient is able to provide his own history. His wife supplements his history. She does not believe he drinks enough water. She gets 16 ounce bottles and puts them in the fridge and he may drink wine or little over one bottle per day. He feels his tremor exacerbates with being nervous. He has had bouts of tremor exacerbation and he feels really exhausted after that. Resting helps. She feels that he does not walk as well. He often does not pick up his feet very well. Thankfully he has not fallen. He has noted intermittent chest tightness. This happens when he goes for a walk and comes out from outside. It is particularly worse when the wind is blowing. He does not have radiating chest pain or shortness of breath and it does not stop him in his tracks but he does feel tightness across his chest. It helps to put a heat pad on his chest. It does not happen at rest. He had his regular checkup with his primary care physician in January.   Previously:   I saw him on 01/15/2014, at which time he reported that his tremor was better and he was tolerating Topamax, 25 mg strength 2 at night. He reported no other changes in his medications or medical history but had an episode of sinus congestion for which he had taken over-the-counter medication. I suggested that he gradually increase the Topamax to 50 mg twice daily in 2  increments. I suggested he continue with Inderal LA 120 mg once daily.  I first met him on 10/09/2013, at which time I felt he most likely had essential tremor. He does have a family history of Parkinson's disease. He was tried on primidone in the past and had side effects as well as on a beta blocker which stopped working. I suggested a trial of Trokendi XR, but the patient's insurance did not cover it and he could not afford it. I switched him to generic Topamax. I checked a TSH, which was normal.   He has had a chronic upper extremity tremor for the past 6 years. He was treated with primidone some 3 years ago, by Dr. Justine Null in [facility] and has been seeing him for the past 3 years. Primidone caused side effects, including depression and personality changes per wife. He has been on Inderal LA, which helped in the beginning, but then stopped working. He was on it for about 2 years, also through Dr. Justine Null. He had not seen him in years. His mother had a hand and head tremor and a diagnosis of parkinson's disease. Looking back, he recalls, he had a intermittent hand tremor when he was in his 20s, but it improved or stabilized. He does not drink alcohol. He does not have a head or voice tremor, and no other family member with tremor. He denies balance problems and has not fallen. He has a more tremulous handwriting. He  does not have glaucoma or kidney stones and does have some hearing loss. He had noise exposure at work. He worked at [employer] for 35 years. He quit smoking in '77.    His Past Medical History Is Significant For: Past Medical History  Diagnosis Date  . Hypertension   . Essential tremor 01/15/2014    His Past Surgical History Is Significant For: Past Surgical History  Procedure Laterality Date  . Tumor removal      neck    His Family History Is Significant For: Family History  Problem Relation Lopez of Onset  . Parkinsonism Mother     His Social History Is Significant  For: History   Social History  . Marital Status: Married    Spouse Name: Everlene Farrier  . Number of Children: 3  . Years of Education: 12   Occupational History  .      retired   Social History Main Topics  . Smoking status: Former Research scientist (life sciences)  . Smokeless tobacco: Never Used  . Alcohol Use: No  . Drug Use: No  . Sexual Activity: No   Other Topics Concern  . None   Social History Narrative   Patient is right handed,resides with wife    His Allergies Are:  Allergies  Allergen Reactions  . Tramadol Other (See Comments)    "makes him crazy"  :   His Current Medications Are:  Outpatient Encounter Prescriptions as of 01/17/2015  Medication Sig  . amLODipine (NORVASC) 5 MG tablet Take 5 mg by mouth daily.  Marland Kitchen ascorbic acid (VITAMIN C) 500 MG tablet Take 500 mg by mouth daily.  Marland Kitchen aspirin EC 81 MG tablet Take 81 mg by mouth daily at 6 PM.  . atorvastatin (LIPITOR) 80 MG tablet Take 40 mg by mouth every other day.   . ferrous sulfate 325 (65 FE) MG tablet Take 325 mg by mouth daily with breakfast. One tablet by mouth daily  . losartan-hydrochlorothiazide (HYZAAR) 100-25 MG per tablet Take 1 tablet by mouth daily. 1/2 tablet daily  . propranolol ER (INDERAL LA) 120 MG 24 hr capsule Take 1 capsule (120 mg total) by mouth daily.  . [DISCONTINUED] propranolol ER (INDERAL LA) 120 MG 24 hr capsule Take 1 capsule (120 mg total) by mouth daily.  . [DISCONTINUED] Topiramate ER 25 MG CP24 Take 1 tablet by mouth 1 day or 1 dose.  . topiramate (TOPAMAX) 50 MG tablet Take 1 tablet (50 mg total) by mouth 2 (two) times daily.  . [DISCONTINUED] topiramate (TOPAMAX) 25 MG tablet 1 pill each AM and 2 each night x 2 weeks, then 2 pill each AM and 2 pills each night thereafter.  :  Review of Systems:  Out of a complete 14 point review of systems, all are reviewed and negative with the exception of these symptoms as listed below:   Review of Systems  Neurological: Positive for tremors.       He feels  like his tremors are worse. States that sometimes his legs "don't want to work", can last several hours.     Objective:  Neurologic Exam  Physical Exam Physical Examination:   Filed Vitals:   01/17/15 1107  BP: 121/75  Pulse: 68  Temp: 97.9 F (36.6 C)  Resp: 16    General Examination: The patient is a very pleasant 79 y.o. male in no acute distress. He appears well-developed and well-nourished and well groomed.   HEENT: Normocephalic, atraumatic, pupils are equal, round and reactive to light  and accommodation. Funduscopic exam is normal with sharp disc margins noted. Extraocular tracking is good without limitation to gaze excursion or nystagmus noted. Normal smooth pursuit is noted. Hearing is impaired. Face is symmetric with normal facial animation and normal facial sensation. Speech is clear with no dysarthria noted. There is no hypophonia. There is no lip, neck/head, jaw or voice tremor. Neck is perhaps mildly rigid, with full range of passive and active motion. There are no carotid bruits on auscultation. Oropharynx exam reveals: mild to moderate mouth dryness, adequate dental hygiene with dentures in place and mild airway crowding, due to narrow airway entry. Mallampati is class II. Tongue protrudes centrally and palate elevates symmetrically. Tonsils are 1+.    Chest: Clear to auscultation without wheezing, rhonchi or crackles noted.  Heart: S1+S2+0, regular and normal without murmurs, rubs or gallops noted.   Abdomen: Soft, non-tender and non-distended with normal bowel sounds appreciated on auscultation.  Extremities: There is trace edema around both ankles. Pedal pulses are intact.  Skin: Warm and dry without trophic changes noted. There are no varicose veins.  Musculoskeletal: exam reveals no obvious joint deformities, tenderness or joint swelling or erythema.   Neurologically:  Mental status: The patient is awake, alert and oriented in all 4 spheres. His memory,  attention, language and knowledge are appropriate. There is no aphasia, agnosia, apraxia or anomia. Speech is clear with normal prosody and enunciation. Thought process is linear. Mood is congruent and affect is normal.  Cranial nerves are as described above under HEENT exam. In addition, shoulder shrug is normal with equal shoulder height noted. Motor exam: Normal bulk, strength and tone is noted with possible cogwheeling on the R. There is no drift, or rebound. There is an intermittent faster resting tremor, b/l, largely unchanged, coarse, R more than L. There is a bilateral upper extremity postural and action tremor, which is mild to moderate in degree, stable. There tremor frequency is fairly fast and the amplitude is larger now.   Romberg is negative. Reflexes are 2+ throughout. Fine motor skills are mildly impaired with respect to finger taps, hand movements, rapid alternating patting, foot taps and foot agility, right seems worse.  Cerebellar testing shows no dysmetria or intention tremor on finger to nose testing. Heel to shin is unremarkable bilaterally. There is no truncal or gait ataxia.  Sensory exam is intact to light touch, pinprick, vibration, temperature sense in the upper and lower extremities.  Gait, station and balance: He stands up with mild difficulty and posture is mildly stooped, probably Lopez-appropriate. He does walk with good stride length and pace but does have reduced arm swing bilaterally, stable. No veering to one side is noted. No leaning to one side is noted. He turns in 3 steps.        Assessment and Plan:   In summary, Andres Lopez is a very pleasant 79 year old male with an underlying medical history of hypertension, hyperlipidemia, diverticulitis, BPH with elevated PSA, whose history and physical exam are in keeping with essential tremor, affecting both upper extremities with a resting tremor, R more than L. He does have a family history of Parkinson's disease in his  mother as well as reduced arm swing bilaterally, but overall stable findings and I do not detect much in the way of parkinsonian features otherwise, all though I cannot be 415% certain. He has slight lateralization to the right and fine motor skills seem a little bit worse as well as his walking and his posture. As  far as his tremor, it seems to flare up when he is nervous or anxious. He has noticed an increase in the generic propranolol long-acting 120 mg once daily prescription. I've advised him to try another pharmacy and I gave him a written prescription today. I would like to continue with both long-acting propranolol at current dose and topiramate 50 mg twice daily. I renewed both prescriptions for 90 day supply. We will continue to monitor for parkinsonism. He is advised to walk regularly for exercise and drink more water. He's not well hydrated typically. As far as his chest tightness is concerned this could be musculoskeletal but the fact that it happens typically after he has been walking and comes from outside and when the wind is blowing it feels worse, I'm not sure if this could not be a form of angina. He is advised to talk to his primary care physician about this.  I would like to see the patient back in 6 months, sooner if the need arises and encouraged them to call with any interim questions, concerns, problems or updates and refill requests.  I spent 25 minutes in total face-to-face time with the patient, more than 50% of which was spent in counseling and coordination of care, reviewing test results, reviewing medication and discussing or reviewing the diagnosis of ET, and PD, the prognosis and treatment options.

## 2015-01-17 NOTE — Patient Instructions (Signed)
  Please remember, that any kind of tremor may be exacerbated by anxiety, anger, nervousness, excitement, dehydration, sleep deprivation, by caffeine, and low blood sugar values or blood sugar fluctuations. Some medications, especially some antidepressants and lithium can cause or exacerbate tremors. Tremors may temporarily calm down her subside with the use of a benzodiazepine such as Valium or related medications and with alcohol. Be aware however that drinking alcohol is not an approved treatment or appropriate treatment for tremor control and long-term use of benzodiazepines such as Valium, lorazepam, alprazolam, or clonazepam can cause habit formation, physical and psychological addiction.  We will continue with your Topamax 50 mg twice daily and propranolol ER 120 mg once daily.   For your chest tightness, please see Dr. Shelia Media soon. I am not sure if this is just muscular pain.   I will see you back in 6 month.   You may have a hint of parkinsonism, but we should just monitor for signs of Parkinson's.   Try to walk every day.   Drink more water.

## 2015-01-22 DIAGNOSIS — L280 Lichen simplex chronicus: Secondary | ICD-10-CM | POA: Diagnosis not present

## 2015-01-22 DIAGNOSIS — R0789 Other chest pain: Secondary | ICD-10-CM | POA: Diagnosis not present

## 2015-01-22 DIAGNOSIS — L304 Erythema intertrigo: Secondary | ICD-10-CM | POA: Diagnosis not present

## 2015-01-22 DIAGNOSIS — L309 Dermatitis, unspecified: Secondary | ICD-10-CM | POA: Diagnosis not present

## 2015-02-03 DIAGNOSIS — D509 Iron deficiency anemia, unspecified: Secondary | ICD-10-CM | POA: Diagnosis not present

## 2015-02-03 DIAGNOSIS — N183 Chronic kidney disease, stage 3 (moderate): Secondary | ICD-10-CM | POA: Diagnosis not present

## 2015-02-03 DIAGNOSIS — N2581 Secondary hyperparathyroidism of renal origin: Secondary | ICD-10-CM | POA: Diagnosis not present

## 2015-02-03 DIAGNOSIS — D631 Anemia in chronic kidney disease: Secondary | ICD-10-CM | POA: Diagnosis not present

## 2015-02-03 DIAGNOSIS — I129 Hypertensive chronic kidney disease with stage 1 through stage 4 chronic kidney disease, or unspecified chronic kidney disease: Secondary | ICD-10-CM | POA: Diagnosis not present

## 2015-02-06 DIAGNOSIS — R079 Chest pain, unspecified: Secondary | ICD-10-CM | POA: Diagnosis not present

## 2015-02-06 DIAGNOSIS — I1310 Hypertensive heart and chronic kidney disease without heart failure, with stage 1 through stage 4 chronic kidney disease, or unspecified chronic kidney disease: Secondary | ICD-10-CM | POA: Diagnosis not present

## 2015-02-06 DIAGNOSIS — N189 Chronic kidney disease, unspecified: Secondary | ICD-10-CM | POA: Diagnosis not present

## 2015-02-06 DIAGNOSIS — R0789 Other chest pain: Secondary | ICD-10-CM | POA: Diagnosis not present

## 2015-02-06 DIAGNOSIS — E876 Hypokalemia: Secondary | ICD-10-CM | POA: Diagnosis not present

## 2015-02-06 DIAGNOSIS — E785 Hyperlipidemia, unspecified: Secondary | ICD-10-CM | POA: Diagnosis not present

## 2015-02-07 ENCOUNTER — Other Ambulatory Visit: Payer: Self-pay | Admitting: *Deleted

## 2015-02-07 DIAGNOSIS — D649 Anemia, unspecified: Secondary | ICD-10-CM

## 2015-02-10 ENCOUNTER — Encounter: Payer: Self-pay | Admitting: Hematology

## 2015-02-10 ENCOUNTER — Ambulatory Visit (HOSPITAL_BASED_OUTPATIENT_CLINIC_OR_DEPARTMENT_OTHER): Payer: Medicare Other | Admitting: Hematology

## 2015-02-10 ENCOUNTER — Telehealth: Payer: Self-pay | Admitting: Hematology

## 2015-02-10 ENCOUNTER — Other Ambulatory Visit (HOSPITAL_BASED_OUTPATIENT_CLINIC_OR_DEPARTMENT_OTHER): Payer: Medicare Other

## 2015-02-10 VITALS — BP 118/51 | HR 59 | Temp 98.0°F | Resp 18 | Ht 65.0 in | Wt 173.6 lb

## 2015-02-10 DIAGNOSIS — D72829 Elevated white blood cell count, unspecified: Secondary | ICD-10-CM | POA: Diagnosis not present

## 2015-02-10 DIAGNOSIS — D649 Anemia, unspecified: Secondary | ICD-10-CM | POA: Diagnosis not present

## 2015-02-10 LAB — CBC WITH DIFFERENTIAL/PLATELET
BASO%: 0.4 % (ref 0.0–2.0)
BASOS ABS: 0.1 10*3/uL (ref 0.0–0.1)
EOS ABS: 0.1 10*3/uL (ref 0.0–0.5)
EOS%: 1.2 % (ref 0.0–7.0)
HCT: 39.3 % (ref 38.4–49.9)
HGB: 13 g/dL (ref 13.0–17.1)
LYMPH%: 24.2 % (ref 14.0–49.0)
MCH: 31.3 pg (ref 27.2–33.4)
MCHC: 33.1 g/dL (ref 32.0–36.0)
MCV: 94.5 fL (ref 79.3–98.0)
MONO#: 1 10*3/uL — AB (ref 0.1–0.9)
MONO%: 8.1 % (ref 0.0–14.0)
NEUT%: 66.1 % (ref 39.0–75.0)
NEUTROS ABS: 7.9 10*3/uL — AB (ref 1.5–6.5)
Platelets: 339 10*3/uL (ref 140–400)
RBC: 4.16 10*6/uL — AB (ref 4.20–5.82)
RDW: 14.2 % (ref 11.0–14.6)
WBC: 12 10*3/uL — AB (ref 4.0–10.3)
lymph#: 2.9 10*3/uL (ref 0.9–3.3)

## 2015-02-10 LAB — IRON AND TIBC CHCC
%SAT: 31 % (ref 20–55)
IRON: 89 ug/dL (ref 42–163)
TIBC: 289 ug/dL (ref 202–409)
UIBC: 200 ug/dL (ref 117–376)

## 2015-02-10 LAB — COMPREHENSIVE METABOLIC PANEL (CC13)
ALK PHOS: 111 U/L (ref 40–150)
ALT: 12 U/L (ref 0–55)
AST: 10 U/L (ref 5–34)
Albumin: 3.5 g/dL (ref 3.5–5.0)
Anion Gap: 12 mEq/L — ABNORMAL HIGH (ref 3–11)
BILIRUBIN TOTAL: 0.25 mg/dL (ref 0.20–1.20)
BUN: 15 mg/dL (ref 7.0–26.0)
CO2: 21 meq/L — AB (ref 22–29)
Calcium: 9 mg/dL (ref 8.4–10.4)
Chloride: 110 mEq/L — ABNORMAL HIGH (ref 98–109)
Creatinine: 1.1 mg/dL (ref 0.7–1.3)
EGFR: 65 mL/min/{1.73_m2} — AB (ref >90)
Glucose: 111 mg/dl (ref 70–140)
Potassium: 3.4 mEq/L — ABNORMAL LOW (ref 3.5–5.1)
SODIUM: 143 meq/L (ref 136–145)
Total Protein: 6.9 g/dL (ref 6.4–8.3)

## 2015-02-10 LAB — FERRITIN CHCC: Ferritin: 442 ng/ml — ABNORMAL HIGH (ref 22–316)

## 2015-02-10 NOTE — Telephone Encounter (Signed)
Gave avs & calendar for October. °

## 2015-02-10 NOTE — Progress Notes (Signed)
[facility] PROGRESS NOTE Date of Visit: 08/02/2015  Andres Pel, MD [address] [place] [address]  DIAGNOSIS: No diagnosis found.   INTERVAL HISTORY:  Andres Lopez 79 y.o. male who was initially referred by Dr. Shelia Media for management and evaluation of his chronic anemia on 12/10/2013. Today, he presents to my clinic by himself. He was last seen by Dr. Lona Kettle 6 month ago.   He is doing very well clinically. His essential tremor is stable. He denies any new symptoms since last visit. He denies any pain, fatigue, or shortness breath. He functions well at home. He lives with his wife in the take of each other. He continues taking ferrous sulfate one tablet daily and tolerates it well.   MEDICAL HISTORY: Past Medical History  Diagnosis Date  . Hypertension   . Essential tremor 01/15/2014    INTERIM HISTORY: has Dehydration; Hypokalemia; Anemia; HTN (hypertension); Leukocytosis, unspecified; and Essential tremor on his problem list.    ALLERGIES:  is allergic to tramadol.  MEDICATIONS:    Medication List       This list is accurate as of: 02/10/15  2:04 PM.  Always use your most recent med list.               amLODipine 5 MG tablet  Commonly known as:  NORVASC  Take 5 mg by mouth daily.     ascorbic acid 500 MG tablet  Commonly known as:  VITAMIN C  Take 500 mg by mouth daily.     aspirin EC 81 MG tablet  Take 81 mg by mouth daily at 6 PM.     atorvastatin 80 MG tablet  Commonly known as:  LIPITOR  Take 40 mg by mouth every other day.     ferrous sulfate 325 (65 FE) MG tablet  Take 325 mg by mouth daily with breakfast. One tablet by mouth daily     losartan-hydrochlorothiazide 100-25 MG per tablet  Commonly known as:  HYZAAR  Take 1 tablet by mouth daily. 1/2 tablet daily     propranolol ER 120 MG 24 hr capsule  Commonly known as:  INDERAL LA  Take 1 capsule (120 mg total) by mouth daily.     topiramate 50 MG tablet  Commonly known as:  TOPAMAX  Take 1 tablet (50 mg total) by mouth 2 (two) times daily.        SURGICAL HISTORY:  Past Surgical History  Procedure Laterality Date  . Tumor removal      neck    REVIEW OF SYSTEMS:   Constitutional: Denies fevers, chills or abnormal weight loss Eyes: Denies blurriness of vision Ears, nose, mouth, throat, and face: Denies mucositis or sore throat Respiratory: Denies cough, dyspnea or wheezes Cardiovascular: Denies palpitation, chest discomfort or lower extremity swelling Gastrointestinal:  Denies nausea, heartburn or change in bowel habits Skin: Denies abnormal skin rashes Lymphatics: Denies new lymphadenopathy or easy bruising Neurological:Denies numbness, tingling or new weaknesses Behavioral/Psych: Mood is stable, no new changes  All other systems were reviewed with the patient and are negative.  PHYSICAL EXAMINATION: ECOG PERFORMANCE STATUS: 0  Blood pressure 118/51, pulse 59, temperature 98 F (36.7 C), temperature source Oral, resp. rate 18, height 5\' 5"  (1.651 m), weight 173 lb 9.6 oz (78.744 kg), SpO2 98 %.  GENERAL:alert, no distress and comfortable; tremors but otherwise gets to table without difficulty. Mildly overweight.  SKIN: skin color, texture, turgor are normal, no rashes or significant lesions  EYES: normal, Conjunctiva are pink and non-injected, sclera clear OROPHARYNX:no exudate, no erythema and lips, buccal mucosa, and tongue normal  NECK: supple, thyroid normal size, non-tender, without nodularity LYMPH:  no palpable lymphadenopathy in the cervical, axillary or supraclavicular LUNGS: clear to auscultation with normal breathing effort, no wheezes or rhonchi HEART: regular rate & rhythm and no murmurs and no lower extremity edema ABDOMEN:abdomen soft, non-tender and normal bowel sounds Musculoskeletal:no cyanosis of digits and no clubbing  NEURO: alert & oriented x 3 with fluent speech, no focal  motor/sensory deficits  Labs:  CBC Latest Ref Rng 02/10/2015 08/01/2014 04/01/2014  WBC 4.0 - 10.3 10e3/uL 12.0(H) 10.0 11.4(H)  Hemoglobin 13.0 - 17.1 g/dL 13.0 12.0(L) 12.5(L)  Hematocrit 38.4 - 49.9 % 39.3 37.3(L) 37.7(L)  Platelets 140 - 400 10e3/uL 339 346 341    CMP Latest Ref Rng 02/10/2015 08/01/2014 10/10/2013  Glucose 70 - 140 mg/dl 111 71 105  BUN 7.0 - 26.0 mg/dL 15.0 18.5 16.0  Creatinine 0.7 - 1.3 mg/dL 1.1 1.3 1.2  Sodium 136 - 145 mEq/L 143 142 143  Potassium 3.5 - 5.1 mEq/L 3.4(L) 4.2 3.3(L)  Chloride 96 - 112 mEq/L - - -  CO2 22 - 29 mEq/L 21(L) 25 25  Calcium 8.4 - 10.4 mg/dL 9.0 9.6 9.4  Total Protein 6.4 - 8.3 g/dL 6.9 6.9 7.1  Total Bilirubin 0.20 - 1.20 mg/dL 0.25 0.29 0.49  Alkaline Phos 40 - 150 U/L 111 92 110  AST 5 - 34 U/L 10 13 14   ALT 0 - 55 U/L 12 18 10      RADIOGRAPHIC STUDIES:  CXR 2 VIEW:09/10/2013  IMPRESSION:  No active cardiopulmonary disease.   ASSESSMENT: Andres Lopez 79 y.o. male with a history of No diagnosis found.   PLAN:   1. Chronic Anemia NOS, likely iron deficiency -His iron study in 2014 showed normal ferritin at 210, low serum iron and saturation. -His anemia has been gradually improved since he started iron supplement. His hemoglobin today is normal at 13. -His clinical presentation is more consistent with iron deficient anemia --GI work-up is fairly recent making a GI bleeding source less likely. Still he does have a documented history of diverticulosis seen on colonoscopy done on 12/19/2006 due to hemoccult positivity.  -Continue ferrous sulfate once daily.  2. Leukocytosis, chronic and mild.  -His white count is 12 today, with predominant neutrophils. Differential is normal. -This has been chronic and stable. Likely reactive. I do not think he needs further workup.   3. Follow-up.  --Follow up in 6 months with CBC, CMET,  iron studies and ferritin and symptom visit.   All questions were answered. The patient knows  to call the clinic with any problems, questions or concerns. We can certainly see the patient much sooner if necessary.  I spent 15 minutes counseling the patient face to face. The total time spent in the appointment was 20 minutes.   Truitt Merle  02/10/2015

## 2015-07-21 ENCOUNTER — Ambulatory Visit (INDEPENDENT_AMBULATORY_CARE_PROVIDER_SITE_OTHER): Payer: Medicare Other | Admitting: Neurology

## 2015-07-21 ENCOUNTER — Encounter: Payer: Self-pay | Admitting: Neurology

## 2015-07-21 VITALS — BP 122/60 | HR 66 | Resp 16 | Ht 65.0 in | Wt 174.0 lb

## 2015-07-21 DIAGNOSIS — Z820 Family history of epilepsy and other diseases of the nervous system: Secondary | ICD-10-CM

## 2015-07-21 DIAGNOSIS — G250 Essential tremor: Secondary | ICD-10-CM | POA: Diagnosis not present

## 2015-07-21 MED ORDER — TOPIRAMATE 50 MG PO TABS: ORAL_TABLET | ORAL | Status: DC

## 2015-07-21 MED ORDER — PROPRANOLOL HCL ER 120 MG PO CP24: 120.0000 mg | ORAL_CAPSULE | Freq: Every day | ORAL | Status: DC

## 2015-07-21 NOTE — Patient Instructions (Addendum)
Reduce your salt intake (you like peanuts, potato chips and ranch dressing!) as you have some leg swelling.  For your tremors, let's increase your topamax to 50 mg: 1 in the morning and 1 1/2 at night and if tolerated, after 2 weeks, we will have you go up to 1 1/2 pills twice daily.   Please reduce your tea intake and increase your water intake.

## 2015-07-21 NOTE — Progress Notes (Signed)
Subjective:    Patient ID: Andres Lopez is a 79 y.o. male.  HPI     Interim history:  Andres Lopez is a very pleasant 79 year old right-handed gentleman with an underlying medical history of hypertension, hyperlipidemia, diverticulitis, BPH with elevated PSA and chronic anemia, who presents for followup consultation of his essential tremor. He is accompanied by his wife today. I last saw him on 01/17/2015, at which time he reported exacerbation of tremor with being nervous. His wife reported that he was not drinking enough water. He was not picking up his feet very well. He had no recent falls. He had noted intermittent chest tightness. He noticed this after coming back from a walk and seemed to be particularly worse when the wind was blowing. He denied any shortness of breath or radiating chest pain. Heat pad to his chest was helpful. He had his regular checkup with his primary care physician in January 2016. I suggested we continue with long-acting propranolol and topiramate twice daily. I asked him to drink more water. I advised him to talk to his primary care physician about his episodic chest tightness.   Today, 07/21/2015: He reports that his tremor is worse. Sometimes it is difficult for him to start walking. Thankfully he has not fallen recently. He tries to stay active. His tremor is worse when he is nervous or anxious or angry per wife. He does not drink much water. He likes to drink tea. He has not noticed lower extremity swelling but we noticed it today during the exam. His wife admits that he likes to eat potato chips and when asked, he also admits that he likes peanut butter and branch dressing.  Previously:   I saw him on 07/19/2014, at which time he felt stable. He had some day-to-day fluctuation of his tremors. I suggested he continue with the same medications, Topamax 50 mg twice daily and Inderal LA 120 mg once daily.  I saw him on 01/15/2014, at which time he reported that his  tremor was better and he was tolerating Topamax, 25 mg strength 2 at night. He reported no other changes in his medications or medical history but had an episode of sinus congestion for which he had taken over-the-counter medication. I suggested that he gradually increase the Topamax to 50 mg twice daily in 2 increments. I suggested he continue with Inderal LA 120 mg once daily.  I first met him on 10/09/2013, at which time I felt he most likely had essential tremor. He does have a family history of Parkinson's disease. He was tried on primidone in the past and had side effects as well as on a beta blocker which stopped working. I suggested a trial of Trokendi XR, but the patient's insurance did not cover it and he could not afford it. I switched him to generic Topamax. I checked a TSH, which was normal.   He has had a chronic upper extremity tremor for the past 6 years. He was treated with primidone some 3 years ago, by Dr. Justine Null in [facility] and has been seeing him for the past 3 years. Primidone caused side effects, including depression and personality changes per wife. He has been on Inderal LA, which helped in the beginning, but then stopped working. He was on it for about 2 years, also through Dr. Justine Null. He had not seen him in years. His mother had a hand and head tremor and a diagnosis of parkinson's disease. Looking back, he recalls, he had a  intermittent hand tremor when he was in his 73s, but it improved or stabilized. He does not drink alcohol. He does not have a head or voice tremor, and no other family member with tremor. He denies balance problems and has not fallen. He has a more tremulous handwriting. He does not have glaucoma or kidney stones and does have some hearing loss. He had noise exposure at work. He worked at [employer] for 35 years. He quit smoking in '77.    His Past Medical History Is Significant For: Past Medical History  Diagnosis Date  . Hypertension   .  Essential tremor 01/15/2014    His Past Surgical History Is Significant For: Past Surgical History  Procedure Laterality Date  . Tumor removal      neck    His Family History Is Significant For: Family History  Problem Relation Age of Onset  . Parkinsonism Mother     His Social History Is Significant For: Social History   Social History  . Marital Status: Married    Spouse Name: Everlene Farrier  . Number of Children: 3  . Years of Education: 12   Occupational History  .      retired   Social History Main Topics  . Smoking status: Former Research scientist (life sciences)  . Smokeless tobacco: Never Used  . Alcohol Use: No  . Drug Use: No  . Sexual Activity: No   Other Topics Concern  . None   Social History Narrative   Patient is right handed,resides with wife    His Allergies Are:  Allergies  Allergen Reactions  . Tramadol Other (See Comments)    "makes him crazy"  :   His Current Medications Are:  Outpatient Encounter Prescriptions as of 07/21/2015  Medication Sig  . amLODipine (NORVASC) 5 MG tablet Take 5 mg by mouth daily.  Marland Kitchen ascorbic acid (VITAMIN C) 500 MG tablet Take 500 mg by mouth daily.  Marland Kitchen aspirin EC 81 MG tablet Take 81 mg by mouth daily at 6 PM.  . atorvastatin (LIPITOR) 80 MG tablet Take 40 mg by mouth every other day.   . ferrous sulfate 325 (65 FE) MG tablet Take 325 mg by mouth daily with breakfast. One tablet by mouth daily  . losartan-hydrochlorothiazide (HYZAAR) 100-25 MG per tablet Take 1 tablet by mouth daily. 1/2 tablet daily  . propranolol ER (INDERAL LA) 120 MG 24 hr capsule Take 1 capsule (120 mg total) by mouth daily.  Marland Kitchen topiramate (TOPAMAX) 50 MG tablet Take 1 tablet (50 mg total) by mouth 2 (two) times daily.   No facility-administered encounter medications on file as of 07/21/2015.  :  Review of Systems:  Out of a complete 14 point review of systems, all are reviewed and negative with the exception of these symptoms as listed below:   Review of Systems   Neurological:       Patient reports that his tremors are worse. He states that sometimes its hard for him to start walking.     Objective:  Neurologic Exam  Physical Exam Physical Examination:   Filed Vitals:   07/21/15 1520  BP: 122/60  Pulse: 66  Resp: 16    General Examination: The patient is a very pleasant 79 y.o. male in no acute distress. He appears well-developed and well-nourished and well groomed.   HEENT: Normocephalic, atraumatic, pupils are equal, round and reactive to light and accommodation. Funduscopic exam is normal with sharp disc margins noted. Extraocular tracking is good without limitation to  gaze excursion or nystagmus noted. Normal smooth pursuit is noted. Hearing is impaired. Face is symmetric with normal facial animation and normal facial sensation. Speech is clear with no dysarthria noted. There is no hypophonia. There is no lip, neck/head, jaw or voice tremor. Neck is perhaps mildly rigid, with full range of passive and active motion. There are no carotid bruits on auscultation. Oropharynx exam reveals: mild to moderate mouth dryness, adequate dental hygiene with dentures in place and mild airway crowding, due to narrow airway entry. Mallampati is class II. Tongue protrudes centrally and palate elevates symmetrically. Tonsils are 1+.    Chest: Clear to auscultation without wheezing, rhonchi or crackles noted.  Heart: S1+S2+0, regular and normal without murmurs, rubs or gallops noted.   Abdomen: Soft, non-tender and non-distended with normal bowel sounds appreciated on auscultation.  Extremities: There is 1+ edema around both ankles. Pedal pulses are intact.  Skin: Warm and dry without trophic changes noted. There are no varicose veins.  Musculoskeletal: exam reveals no obvious joint deformities, tenderness or joint swelling or erythema.   Neurologically:  Mental status: The patient is awake, alert and oriented in all 4 spheres. His memory, attention,  language and knowledge are appropriate. There is no aphasia, agnosia, apraxia or anomia. Speech is clear with normal prosody and enunciation. Thought process is linear. Mood is congruent and affect is normal.  Cranial nerves are as described above under HEENT exam. In addition, shoulder shrug is normal with equal shoulder height noted. Motor exam: Normal bulk, strength and tone is noted with possible cogwheeling on the R. There is no drift, or rebound. There is an intermittent faster resting tremor, b/l, largely unchanged, coarse, R more than L (slightly). There is a bilateral upper extremity postural and action tremor, which is mild to moderate in degree, stable. There tremor frequency is fairly fast and the amplitude is larger now.   Romberg is negative. Reflexes are 2+ throughout. Fine motor skills are mildly impaired with respect to finger taps, hand movements, rapid alternating patting, foot taps and foot agility, right seems worse.  Cerebellar testing shows no dysmetria or intention tremor on finger to nose testing. Heel to shin is unremarkable bilaterally. There is no truncal or gait ataxia.  Sensory exam is intact to light touch in the upper and lower extremities.  Gait, station and balance: He stands up with mild difficulty and pushes himself up. Posture is mildly stooped, he walks with decreased arm swing bilaterally, but it appears to be he is trying to contain the trembling. He turns in 3 steps and slightly insecurely. This seems slightly worse from last time.  Assessment and Plan:   In summary, Andres Lopez is a very pleasant 79 year old male with an underlying medical history of hypertension, hyperlipidemia, diverticulitis, BPH with elevated PSA, whose history and physical exam are in keeping with essential tremor, affecting both upper extremities with a resting tremor, R more than L. He has progressed with time and has a family history of Parkinson's disease in his mother. While he does  have a reduced arm swing bilaterally, he has otherwise not much in the way of parkinsonian features, although with a slight lateralization to the right. As far as his tremor, it seems to flare up when he is nervous or anxious or angry, per wife. He has been on propranolol long-acting 120 mg once daily and I would like to continue with this. As far as the topiramate 50 mg, I would like to increase this to  75 mg twice daily, if possible. To that end, he is advised to take 1 pill in AM and 1 1/2 pills each night for 2 weeks, then 1 1/2 pills bid after that, if tolerated. He does note some sleepiness after taking it. They are advised to monitor the sedation. He is advised to reduce his salt intake. He has some lower extremity swelling and is advised to monitor the degree of swelling. He is advised to drink more water and reduce his tea intake. He does not drink much water and primarily drink sweet tea.   I would like to see the patient back in 4 months, sooner if the need arises and encouraged them to call with any interim questions, concerns, problems or updates and refill requests.  I spent 25 minutes in total face-to-face time with the patient, more than 50% of which was spent in counseling and coordination of care, reviewing test results, reviewing medication and discussing or reviewing the diagnosis of ET, and PD, the prognosis and treatment options.

## 2015-07-31 DIAGNOSIS — D649 Anemia, unspecified: Secondary | ICD-10-CM | POA: Diagnosis not present

## 2015-07-31 DIAGNOSIS — R251 Tremor, unspecified: Secondary | ICD-10-CM | POA: Diagnosis not present

## 2015-07-31 DIAGNOSIS — Z23 Encounter for immunization: Secondary | ICD-10-CM | POA: Diagnosis not present

## 2015-07-31 DIAGNOSIS — R600 Localized edema: Secondary | ICD-10-CM | POA: Diagnosis not present

## 2015-08-08 ENCOUNTER — Other Ambulatory Visit: Payer: Self-pay | Admitting: *Deleted

## 2015-08-08 DIAGNOSIS — D508 Other iron deficiency anemias: Secondary | ICD-10-CM

## 2015-08-11 ENCOUNTER — Encounter: Payer: Self-pay | Admitting: Hematology

## 2015-08-11 ENCOUNTER — Other Ambulatory Visit (HOSPITAL_BASED_OUTPATIENT_CLINIC_OR_DEPARTMENT_OTHER): Payer: Medicare Other

## 2015-08-11 ENCOUNTER — Telehealth: Payer: Self-pay | Admitting: Hematology

## 2015-08-11 ENCOUNTER — Ambulatory Visit (HOSPITAL_BASED_OUTPATIENT_CLINIC_OR_DEPARTMENT_OTHER): Payer: Medicare Other | Admitting: Hematology

## 2015-08-11 VITALS — BP 118/73 | HR 55 | Temp 98.2°F | Resp 18 | Ht 65.0 in | Wt 173.5 lb

## 2015-08-11 DIAGNOSIS — D72829 Elevated white blood cell count, unspecified: Secondary | ICD-10-CM

## 2015-08-11 DIAGNOSIS — D649 Anemia, unspecified: Secondary | ICD-10-CM

## 2015-08-11 DIAGNOSIS — D508 Other iron deficiency anemias: Secondary | ICD-10-CM

## 2015-08-11 LAB — COMPREHENSIVE METABOLIC PANEL (CC13)
ALT: 13 U/L (ref 0–55)
ANION GAP: 9 meq/L (ref 3–11)
AST: 13 U/L (ref 5–34)
Albumin: 3.5 g/dL (ref 3.5–5.0)
Alkaline Phosphatase: 110 U/L (ref 40–150)
BUN: 15.1 mg/dL (ref 7.0–26.0)
CO2: 20 meq/L — AB (ref 22–29)
CREATININE: 1.2 mg/dL (ref 0.7–1.3)
Calcium: 9.2 mg/dL (ref 8.4–10.4)
Chloride: 115 mEq/L — ABNORMAL HIGH (ref 98–109)
EGFR: 56 mL/min/{1.73_m2} — ABNORMAL LOW (ref >90)
Glucose: 106 mg/dl (ref 70–140)
Potassium: 3.6 mEq/L (ref 3.5–5.1)
Sodium: 144 mEq/L (ref 136–145)
Total Bilirubin: 0.34 mg/dL (ref 0.20–1.20)
Total Protein: 7 g/dL (ref 6.4–8.3)

## 2015-08-11 LAB — CBC WITH DIFFERENTIAL/PLATELET
BASO%: 0.6 % (ref 0.0–2.0)
Basophils Absolute: 0.1 10*3/uL (ref 0.0–0.1)
EOS%: 1.6 % (ref 0.0–7.0)
Eosinophils Absolute: 0.2 10*3/uL (ref 0.0–0.5)
HEMATOCRIT: 39 % (ref 38.4–49.9)
HGB: 12.9 g/dL — ABNORMAL LOW (ref 13.0–17.1)
LYMPH#: 3 10*3/uL (ref 0.9–3.3)
LYMPH%: 21.5 % (ref 14.0–49.0)
MCH: 30.6 pg (ref 27.2–33.4)
MCHC: 33.2 g/dL (ref 32.0–36.0)
MCV: 92.4 fL (ref 79.3–98.0)
MONO#: 0.9 10*3/uL (ref 0.1–0.9)
MONO%: 6.4 % (ref 0.0–14.0)
NEUT#: 9.7 10*3/uL — ABNORMAL HIGH (ref 1.5–6.5)
NEUT%: 69.9 % (ref 39.0–75.0)
PLATELETS: 322 10*3/uL (ref 140–400)
RBC: 4.22 10*6/uL (ref 4.20–5.82)
RDW: 14.5 % (ref 11.0–14.6)
WBC: 13.9 10*3/uL — AB (ref 4.0–10.3)

## 2015-08-11 LAB — IRON AND TIBC CHCC
%SAT: 24 % (ref 20–55)
Iron: 66 ug/dL (ref 42–163)
TIBC: 271 ug/dL (ref 202–409)
UIBC: 205 ug/dL (ref 117–376)

## 2015-08-11 LAB — FERRITIN CHCC: Ferritin: 518 ng/ml — ABNORMAL HIGH (ref 22–316)

## 2015-08-11 NOTE — Progress Notes (Signed)
[facility] PROGRESS NOTE Date of Visit: 08/02/2015  Andres Pel, MD [place] [id number]  DIAGNOSIS: Other iron deficiency anemias  Leukocytosis  CURRENT TREATMENT: Ferrous sulfate 325 mg once daily  INTERVAL HISTORY:  Andres Lopez 79 y.o. male who was initially referred by Dr. Shelia Media for management and evaluation of his chronic anemia on 12/10/2013. Today, he presents to my clinic with his wife. He was last seen by me 6 month ago.   He is doing well overall. He is compliant with her ferrous sulfate once a day. No constipation, no other side effects. He has good appetite and energy level. He is essential tremor is stable. His primary care physician recently adjusting his blood pressure medication, his blood pressure is normal today. No other new complaints. He denies any episodes of bleeding  MEDICAL HISTORY: Past Medical History  Diagnosis Date  . Hypertension   . Essential tremor 01/15/2014    INTERIM HISTORY: has Dehydration; Hypokalemia; Anemia; HTN (hypertension); Leukocytosis; and Essential tremor on his problem list.    ALLERGIES:  is allergic to tramadol.  MEDICATIONS:    Medication List       This list is accurate as of: 08/11/15  2:23 PM.  Always use your most recent med list.               amLODipine 5 MG tablet  Commonly known as:  NORVASC  Take 5 mg by mouth daily.     ascorbic acid 500 MG tablet  Commonly known as:  VITAMIN C  Take 500 mg by mouth daily.     aspirin EC 81 MG tablet  Take 81 mg by mouth daily at 6 PM.     atorvastatin 80 MG tablet  Commonly known as:  LIPITOR  Take 40 mg by mouth every other day.     ferrous sulfate 325 (65 FE) MG tablet  Take 325 mg by mouth daily with breakfast. One tablet by mouth daily     losartan-hydrochlorothiazide 100-25 MG tablet  Commonly known as:  HYZAAR  Take 1 tablet by mouth daily. 1/2 tablet daily     propranolol ER  120 MG 24 hr capsule  Commonly known as:  INDERAL LA  Take 1 capsule (120 mg total) by mouth daily.     topiramate 50 MG tablet  Commonly known as:  TOPAMAX  1 in am and 1 1/2 pills at night for 2 weeks, then 1 1/2 pills 2 times a day thereafter.        SURGICAL HISTORY:  Past Surgical History  Procedure Laterality Date  . Tumor removal      neck    REVIEW OF SYSTEMS:   Constitutional: Denies fevers, chills or abnormal weight loss Eyes: Denies blurriness of vision Ears, nose, mouth, throat, and face: Denies mucositis or sore throat Respiratory: Denies cough, dyspnea or wheezes Cardiovascular: Denies palpitation, chest discomfort or lower extremity swelling Gastrointestinal:  Denies nausea, heartburn or change in bowel habits Skin: Denies abnormal skin rashes Lymphatics: Denies new lymphadenopathy or easy bruising Neurological:Denies numbness, tingling or new weaknesses Behavioral/Psych: Mood is stable, no new changes  All other systems were reviewed with the patient and are negative.  PHYSICAL EXAMINATION: ECOG PERFORMANCE STATUS: 0  Blood pressure 118/73, pulse 55, temperature 98.2 F (36.8 C), temperature source Oral, resp. rate 18, height 5\' 5"  (1.651 m), weight 173 lb 8 oz (78.699 kg), SpO2 99 %.  GENERAL:alert, no distress and comfortable;  tremors but otherwise gets to table without difficulty. Mildly overweight.  SKIN: skin color, texture, turgor are normal, no rashes or significant lesions EYES: normal, Conjunctiva are pink and non-injected, sclera clear OROPHARYNX:no exudate, no erythema and lips, buccal mucosa, and tongue normal  NECK: supple, thyroid normal size, non-tender, without nodularity LYMPH:  no palpable lymphadenopathy in the cervical, axillary or supraclavicular LUNGS: clear to auscultation with normal breathing effort, no wheezes or rhonchi HEART: regular rate & rhythm and no murmurs and no lower extremity edema ABDOMEN:abdomen soft, non-tender and  normal bowel sounds Musculoskeletal:no cyanosis of digits and no clubbing  NEURO: alert & oriented x 3 with fluent speech, no focal motor/sensory deficits  Labs:  CBC Latest Ref Rng 08/11/2015 02/10/2015 08/01/2014  WBC 4.0 - 10.3 10e3/uL 13.9(H) 12.0(H) 10.0  Hemoglobin 13.0 - 17.1 g/dL 12.9(L) 13.0 12.0(L)  Hematocrit 38.4 - 49.9 % 39.0 39.3 37.3(L)  Platelets 140 - 400 10e3/uL 322 339 346    CMP Latest Ref Rng 08/11/2015 02/10/2015 08/01/2014  Glucose 70 - 140 mg/dl 106 111 71  BUN 7.0 - 26.0 mg/dL 15.1 15.0 18.5  Creatinine 0.7 - 1.3 mg/dL 1.2 1.1 1.3  Sodium 136 - 145 mEq/L 144 143 142  Potassium 3.5 - 5.1 mEq/L 3.6 3.4(L) 4.2  Chloride 96 - 112 mEq/L - - -  CO2 22 - 29 mEq/L 20(L) 21(L) 25  Calcium 8.4 - 10.4 mg/dL 9.2 9.0 9.6  Total Protein 6.4 - 8.3 g/dL 7.0 6.9 6.9  Total Bilirubin 0.20 - 1.20 mg/dL 0.34 0.25 0.29  Alkaline Phos 40 - 150 U/L 110 111 92  AST 5 - 34 U/L 13 10 13   ALT 0 - 55 U/L 13 12 18      RADIOGRAPHIC STUDIES:  CXR 2 VIEW:09/10/2013  IMPRESSION:  No active cardiopulmonary disease.   ASSESSMENT: Andres Lopez 79 y.o. male with a history of Other iron deficiency anemias  Leukocytosis   PLAN:   1. Chronic Anemia NOS, likely iron deficiency -His iron study in 2014 showed normal ferritin at 210, low serum iron and saturation. -His anemia has been gradually improved since he started iron supplement. His hemoglobin today is normal at 13. -His clinical presentation is more consistent with iron deficient anemia --GI work-up is fairly recent making a GI bleeding source less likely. Still he does have a documented history of diverticulosis seen on colonoscopy done on 12/19/2006 due to hemoccult positivity.  -His hemoglobin is 12.9 today, repeated a ferritin and iron studies still pending. -Continue ferrous sulfate once daily.  2. Leukocytosis, chronic and mild.  -His white count is 13.9 today, with predominant neutrophils.  -This has been chronic and  stable. Likely reactive. I do not think he needs further workup.   3. Follow-up.  --Follow up in 6 months with CBC,  iron studies and ferritin and symptom visit.  -I'll call him tomorrow for his our study results.  All questions were answered. The patient knows to call the clinic with any problems, questions or concerns. We can certainly see the patient much sooner if necessary.  I spent 15 minutes counseling the patient face to face. The total time spent in the appointment was 20 minutes.   Truitt Merle  08/11/2015

## 2015-08-11 NOTE — Telephone Encounter (Signed)
Gave and printed appt sched and avs for pt for April 2017 °

## 2015-08-12 DIAGNOSIS — I129 Hypertensive chronic kidney disease with stage 1 through stage 4 chronic kidney disease, or unspecified chronic kidney disease: Secondary | ICD-10-CM | POA: Diagnosis not present

## 2015-08-12 DIAGNOSIS — D631 Anemia in chronic kidney disease: Secondary | ICD-10-CM | POA: Diagnosis not present

## 2015-08-12 DIAGNOSIS — N2581 Secondary hyperparathyroidism of renal origin: Secondary | ICD-10-CM | POA: Diagnosis not present

## 2015-08-12 DIAGNOSIS — N183 Chronic kidney disease, stage 3 (moderate): Secondary | ICD-10-CM | POA: Diagnosis not present

## 2015-08-13 ENCOUNTER — Telehealth: Payer: Self-pay | Admitting: *Deleted

## 2015-08-13 NOTE — Telephone Encounter (Signed)
Spoke with Andres Lopez and informed Andres Lopez re: Iron results normal as per Dr. Burr Medico.  Andres Lopez voiced understanding.

## 2015-11-10 DIAGNOSIS — R972 Elevated prostate specific antigen [PSA]: Secondary | ICD-10-CM | POA: Diagnosis not present

## 2015-11-10 DIAGNOSIS — E7800 Pure hypercholesterolemia, unspecified: Secondary | ICD-10-CM | POA: Diagnosis not present

## 2015-11-10 DIAGNOSIS — Z0000 Encounter for general adult medical examination without abnormal findings: Secondary | ICD-10-CM | POA: Diagnosis not present

## 2015-11-10 DIAGNOSIS — I10 Essential (primary) hypertension: Secondary | ICD-10-CM | POA: Diagnosis not present

## 2015-11-10 DIAGNOSIS — Z7982 Long term (current) use of aspirin: Secondary | ICD-10-CM | POA: Diagnosis not present

## 2015-11-10 DIAGNOSIS — D649 Anemia, unspecified: Secondary | ICD-10-CM | POA: Diagnosis not present

## 2015-11-17 DIAGNOSIS — F329 Major depressive disorder, single episode, unspecified: Secondary | ICD-10-CM | POA: Diagnosis not present

## 2015-11-17 DIAGNOSIS — Z86010 Personal history of colonic polyps: Secondary | ICD-10-CM | POA: Diagnosis not present

## 2015-11-17 DIAGNOSIS — I700 Atherosclerosis of aorta: Secondary | ICD-10-CM | POA: Diagnosis not present

## 2015-11-17 DIAGNOSIS — G250 Essential tremor: Secondary | ICD-10-CM | POA: Diagnosis not present

## 2015-11-20 ENCOUNTER — Encounter: Payer: Self-pay | Admitting: Neurology

## 2015-11-20 ENCOUNTER — Ambulatory Visit (INDEPENDENT_AMBULATORY_CARE_PROVIDER_SITE_OTHER): Payer: Medicare Other | Admitting: Neurology

## 2015-11-20 VITALS — BP 136/62 | HR 78 | Resp 16 | Ht 65.0 in | Wt 170.0 lb

## 2015-11-20 DIAGNOSIS — Z820 Family history of epilepsy and other diseases of the nervous system: Secondary | ICD-10-CM

## 2015-11-20 DIAGNOSIS — G250 Essential tremor: Secondary | ICD-10-CM

## 2015-11-20 DIAGNOSIS — R4586 Emotional lability: Secondary | ICD-10-CM

## 2015-11-20 DIAGNOSIS — F39 Unspecified mood [affective] disorder: Secondary | ICD-10-CM | POA: Diagnosis not present

## 2015-11-20 NOTE — Progress Notes (Signed)
Subjective:    Patient ID: Andres Lopez is a 80 y.o. male.  HPI     Interim history:   Andres Lopez is a very pleasant 80 year old right-handed gentleman with an underlying medical history of hypertension, hyperlipidemia, diverticulitis, BPH with elevated PSA and chronic anemia, who presents for followup consultation of his essential tremor. He is accompanied by his wife today. I last saw him on 07/21/2015, at which time he reported that his tremor was worse. He had some difficulty initiating steps. He had not fallen recently. He was still trying to stay as active as possible. His tremor would get worse when he was nervous or anxious or angry per wife. He was not always drinking enough water. He likes to drink tea. He had not noticed any new swelling but I did notice some lower extremity swelling during the exam. He had been eating some salty snacks including potato chips and he likes peanut butter and ranch dressing. I asked him to reduce his salty snack intake. I also asked him to reduce his tea intake and increase his water intake and monitor his swelling. I continued him on long-acting Inderal but asked him to increase Topamax from 50 mg twice daily to 75 mg twice daily in 2 gradual increments.   Today, 11/20/2015: He reports more mood swings, has been going on for months, not worse since the increase in topiramate. Has seen Dr. Shelia Media on 11/18/15 and was started on Remeron 15 mg, 1/2 pill each night, which has helped his sleep. On the positive side, he is sleeping a little better and his tremor is better.his wife reports that he has had irritability, sometimes angry outbursts, sometimes depressive moods. He is not suicidal. He is at times tearful.  Previously:   I saw him on 01/17/2015, at which time he reported exacerbation of tremor with being nervous. His wife reported that he was not drinking enough water. He was not picking up his feet very well. He had no recent falls. He had noted  intermittent chest tightness. He noticed this after coming back from a walk and seemed to be particularly worse when the wind was blowing. He denied any shortness of breath or radiating chest pain. Heat pad to his chest was helpful. He had his regular checkup with his primary care physician in January 2016. I suggested we continue with long-acting propranolol and topiramate twice daily. I asked him to drink more water. I advised him to talk to his primary care physician about his episodic chest tightness.  I saw him on 07/19/2014, at which time he felt stable. He had some day-to-day fluctuation of his tremors. I suggested he continue with the same medications, Topamax 50 mg twice daily and Inderal LA 120 mg once daily.  I saw him on 01/15/2014, at which time he reported that his tremor was better and he was tolerating Topamax, 25 mg strength 2 at night. He reported no other changes in his medications or medical history but had an episode of sinus congestion for which he had taken over-the-counter medication. I suggested that he gradually increase the Topamax to 50 mg twice daily in 2 increments. I suggested he continue with Inderal LA 120 mg once daily.  I first met him on 10/09/2013, at which time I felt he most likely had essential tremor. He does have a family history of Parkinson's disease. He was tried on primidone in the past and had side effects as well as on a beta blocker which stopped working.  I suggested a trial of Trokendi XR, but the patient's insurance did not cover it and he could not afford it. I switched him to generic Topamax. I checked a TSH, which was normal.   He has had a chronic upper extremity tremor for the past 6 years. He was treated with primidone some 3 years ago, by Dr. Justine Null in [facility] and has been seeing him for the past 3 years. Primidone caused side effects, including depression and personality changes per wife. He has been on Inderal LA, which helped in the beginning,  but then stopped working. He was on it for about 2 years, also through Dr. Justine Null. He had not seen him in years. His mother had a hand and head tremor and a diagnosis of parkinson's disease. Looking back, he recalls, he had a intermittent hand tremor when he was in his 20s, but it improved or stabilized. He does not drink alcohol. He does not have a head or voice tremor, and no other family member with tremor. He denies balance problems and has not fallen. He has a more tremulous handwriting. He does not have glaucoma or kidney stones and does have some hearing loss. He had noise exposure at work. He worked at [employer] for 35 years. He quit smoking in '77.     His Past Medical History Is Significant For: Past Medical History  Diagnosis Date  . Hypertension   . Essential tremor 01/15/2014    His Past Surgical History Is Significant For: Past Surgical History  Procedure Laterality Date  . Tumor removal      neck    His Family History Is Significant For: Family History  Problem Relation Age of Onset  . Parkinsonism Mother     His Social History Is Significant For: Social History   Social History  . Marital Status: Married    Spouse Name: Everlene Farrier  . Number of Children: 3  . Years of Education: 12   Occupational History  .      retired   Social History Main Topics  . Smoking status: Former Research scientist (life sciences)  . Smokeless tobacco: Never Used  . Alcohol Use: No  . Drug Use: No  . Sexual Activity: No   Other Topics Concern  . None   Social History Narrative   Patient is right handed,resides with wife    His Allergies Are:  Allergies  Allergen Reactions  . Tramadol Other (See Comments)    "makes him crazy"  :   His Current Medications Are:  Outpatient Encounter Prescriptions as of 11/20/2015  Medication Sig  . amLODipine (NORVASC) 5 MG tablet Take 5 mg by mouth daily.  Marland Kitchen ascorbic acid (VITAMIN C) 500 MG tablet Take 500 mg by mouth daily.  Marland Kitchen aspirin EC 81 MG tablet  Take 81 mg by mouth daily at 6 PM.  . atorvastatin (LIPITOR) 80 MG tablet Take 40 mg by mouth every other day.   . ferrous sulfate 325 (65 FE) MG tablet Take 325 mg by mouth daily with breakfast. One tablet by mouth daily  . losartan-hydrochlorothiazide (HYZAAR) 100-25 MG per tablet Take 1 tablet by mouth daily. 1/2 tablet daily  . mirtazapine (REMERON) 15 MG tablet Take 7.5 mg by mouth at bedtime.  . propranolol ER (INDERAL LA) 120 MG 24 hr capsule Take 1 capsule (120 mg total) by mouth daily.  Marland Kitchen topiramate (TOPAMAX) 50 MG tablet 1 in am and 1 1/2 pills at night for 2 weeks, then 1 1/2 pills  2 times a day thereafter.   No facility-administered encounter medications on file as of 11/20/2015.  :  Review of Systems:  Out of a complete 14 point review of systems, all are reviewed and negative with the exception of these symptoms as listed below:   Review of Systems  Neurological:       Patient is here for f/u. Patient tearfully reports that he would like to discuss his mood swings.     Objective:  Neurologic Exam  Physical Exam Physical Examination:   Filed Vitals:   11/20/15 1354  BP: 136/62  Pulse: 78  Resp: 16    General Examination: The patient is a very pleasant 80 y.o. male in no acute distress. He appears well-developed and well-nourished and well groomed. He is at times tearful during this appointment.  HEENT: Normocephalic, atraumatic, pupils are equal, round and reactive to light and accommodation. Funduscopic exam is normal with sharp disc margins noted. Extraocular tracking is good without limitation to gaze excursion or nystagmus noted. Normal smooth pursuit is noted. Hearing is impaired. Face is symmetric with normal facial animation and normal facial sensation. Speech is clear with no dysarthria noted. There is no hypophonia. There is no lip, neck/head, jaw or voice tremor. Neck is perhaps mildly rigid, with full range of passive and active motion. There are no carotid  bruits on auscultation. Oropharynx exam reveals: mild to moderate mouth dryness, adequate dental hygiene with dentures in place and mild airway crowding, due to narrow airway entry. Mallampati is class II. Tongue protrudes centrally and palate elevates symmetrically. Tonsils are 1+.    Chest: Clear to auscultation without wheezing, rhonchi or crackles noted.  Heart: S1+S2+0, regular and normal without murmurs, rubs or gallops noted.   Abdomen: Soft, non-tender and non-distended with normal bowel sounds appreciated on auscultation.  Extremities: There is trace edema around both ankles. Pedal pulses are intact.  Skin: Warm and dry without trophic changes noted. There are no varicose veins.  Musculoskeletal: exam reveals no obvious joint deformities, tenderness or joint swelling or erythema.   Neurologically:  Mental status: The patient is awake, alert and oriented in all 4 spheres. His memory, attention, language and knowledge are appropriate. There is no aphasia, agnosia, apraxia or anomia. Speech is clear with normal prosody and enunciation. Thought process is linear. Mood is congruent and affect is normal.  Cranial nerves are as described above under HEENT exam. In addition, shoulder shrug is normal with equal shoulder height noted. Motor exam: Normal bulk, strength and tone is noted. There is no drift, or rebound. There is an intermittent faster resting tremor, b/l, largely unchanged, coarse. There is a bilateral upper extremity postural and action tremor, which is mild to moderate in degree, stable. There tremor frequency is fairly fast and the amplitude is larger.   Romberg is negative. Reflexes are 2+ throughout. Fine motor skills are mildly impaired with respect to finger taps, hand movements, rapid alternating patting, foot taps and foot agility, right seems worse.  Cerebellar testing shows no dysmetria or intention tremor on finger to nose testing. Heel to shin is unremarkable bilaterally.  There is no truncal or gait ataxia.  Sensory exam is intact to light touch in the upper and lower extremities.  Gait, station and balance: He stands up with mild difficulty and pushes himself up. Posture is mildly stooped, he walks with decreased arm swing bilaterally, but it appears to be he is trying to contain the trembling. He turns in 3 steps and  slightly insecurely. This seems slightly worse from last time.  Assessment and Plan:   In summary, Ziaire Hagos is a very pleasant 80 year old male with an underlying medical history of hypertension, hyperlipidemia, diverticulitis, BPH with elevated PSA, whose history and physical exam are in keeping with essential tremor, affecting both upper extremities with a resting, postural and action component. We increase the topiramate to 75 mg twice daily and he feels this has helped. He has also been started on Remeron 7.5 mg each night by his primary care physician for his mood and his difficulty sleeping at night which has been helpful. I advised the patient that for mood improvement it can take a little while longer to help him. Nevertheless, he is hopeful. He has not had any telltale parkinsonian signs, but has a family history of Parkinson's disease. We will continue to monitor his tremors in his symptoms. He has a follow-up appointment with his primary care physician in about a month. If needed, he can certainly consider seeing a geriatric psychiatrist for his mood but I encouraged him to wait things out a little longer. He did not need a refill on his long-acting propranolol 120 mg once daily and his topiramate 50 mg strength 1-1/2 pills twice daily. I will see him back routinely in about 6 months, sooner if needed. I answered all her questions today and the patient and his wife were in agreement. I spent 25 minutes in total face-to-face time with the patient, more than 50% of which was spent in counseling and coordination of care, reviewing test results,  reviewing medication and discussing or reviewing the diagnosis of ET, and mood swings, the prognosis and treatment options.

## 2015-11-20 NOTE — Patient Instructions (Signed)
We will continue to monitor your tremors. Your mood may improve with the Remeron Dr. Shelia Media gave you, but it may take weeks for it to work for your mood. You can discuss at your next appointment the possibility of seeing a specialist for your mood, called a geriatric psychiatrist.  I do agree, that you should try to get out more.   Please remember, that any kind of tremor may be exacerbated by anxiety, anger, nervousness, excitement, dehydration, sleep deprivation, by caffeine, and low blood sugar values or blood sugar fluctuations. Some medications, especially some antidepressants and lithium can cause or exacerbate tremors. Tremors may temporarily calm down her subside with the use of a benzodiazepine such as Valium or related medications and with alcohol. Be aware however that drinking alcohol is not an approved treatment or appropriate treatment for tremor control and long-term use of benzodiazepines such as Valium, lorazepam, alprazolam, or clonazepam can cause habit formation, physical and psychological addiction.  We will continue with your medications for tremor.   Try to limit your tea to one bottle a day and try to drink more water.

## 2015-11-25 DIAGNOSIS — R972 Elevated prostate specific antigen [PSA]: Secondary | ICD-10-CM | POA: Diagnosis not present

## 2015-11-25 DIAGNOSIS — N390 Urinary tract infection, site not specified: Secondary | ICD-10-CM | POA: Diagnosis not present

## 2015-11-25 DIAGNOSIS — N3281 Overactive bladder: Secondary | ICD-10-CM | POA: Diagnosis not present

## 2015-11-25 DIAGNOSIS — Z0000 Encounter for general adult medical examination without abnormal findings: Secondary | ICD-10-CM | POA: Diagnosis not present

## 2015-11-25 DIAGNOSIS — N401 Enlarged prostate with lower urinary tract symptoms: Secondary | ICD-10-CM | POA: Diagnosis not present

## 2015-12-15 DIAGNOSIS — N3281 Overactive bladder: Secondary | ICD-10-CM | POA: Diagnosis not present

## 2015-12-15 DIAGNOSIS — Z0000 Encounter for general adult medical examination without abnormal findings: Secondary | ICD-10-CM | POA: Diagnosis not present

## 2015-12-15 DIAGNOSIS — R972 Elevated prostate specific antigen [PSA]: Secondary | ICD-10-CM | POA: Diagnosis not present

## 2015-12-29 DIAGNOSIS — D72829 Elevated white blood cell count, unspecified: Secondary | ICD-10-CM | POA: Diagnosis not present

## 2015-12-29 DIAGNOSIS — G4700 Insomnia, unspecified: Secondary | ICD-10-CM | POA: Diagnosis not present

## 2016-01-26 ENCOUNTER — Telehealth: Payer: Self-pay | Admitting: Hematology

## 2016-01-26 ENCOUNTER — Ambulatory Visit (HOSPITAL_BASED_OUTPATIENT_CLINIC_OR_DEPARTMENT_OTHER): Payer: Medicare Other | Admitting: Hematology

## 2016-01-26 ENCOUNTER — Other Ambulatory Visit: Payer: Self-pay | Admitting: Hematology

## 2016-01-26 ENCOUNTER — Other Ambulatory Visit (HOSPITAL_BASED_OUTPATIENT_CLINIC_OR_DEPARTMENT_OTHER): Payer: Medicare Other

## 2016-01-26 ENCOUNTER — Encounter: Payer: Self-pay | Admitting: Hematology

## 2016-01-26 VITALS — BP 121/47 | HR 53 | Temp 97.9°F | Resp 16 | Ht 65.0 in | Wt 172.1 lb

## 2016-01-26 DIAGNOSIS — D649 Anemia, unspecified: Secondary | ICD-10-CM | POA: Diagnosis not present

## 2016-01-26 DIAGNOSIS — D72829 Elevated white blood cell count, unspecified: Secondary | ICD-10-CM | POA: Diagnosis not present

## 2016-01-26 DIAGNOSIS — D509 Iron deficiency anemia, unspecified: Secondary | ICD-10-CM

## 2016-01-26 LAB — IRON AND TIBC
%SAT: 22 % (ref 20–55)
Iron: 62 ug/dL (ref 42–163)
TIBC: 279 ug/dL (ref 202–409)
UIBC: 217 ug/dL (ref 117–376)

## 2016-01-26 LAB — CBC & DIFF AND RETIC
BASO%: 0.3 % (ref 0.0–2.0)
BASOS ABS: 0 10*3/uL (ref 0.0–0.1)
EOS ABS: 0.2 10*3/uL (ref 0.0–0.5)
EOS%: 1.6 % (ref 0.0–7.0)
HEMATOCRIT: 39.6 % (ref 38.4–49.9)
HEMOGLOBIN: 13 g/dL (ref 13.0–17.1)
IMMATURE RETIC FRACT: 8 % (ref 3.00–10.60)
LYMPH%: 20.3 % (ref 14.0–49.0)
MCH: 30.7 pg (ref 27.2–33.4)
MCHC: 32.8 g/dL (ref 32.0–36.0)
MCV: 93.4 fL (ref 79.3–98.0)
MONO#: 1 10*3/uL — AB (ref 0.1–0.9)
MONO%: 6.9 % (ref 0.0–14.0)
NEUT#: 9.8 10*3/uL — ABNORMAL HIGH (ref 1.5–6.5)
NEUT%: 70.9 % (ref 39.0–75.0)
PLATELETS: 304 10*3/uL (ref 140–400)
RBC: 4.24 10*6/uL (ref 4.20–5.82)
RDW: 14.7 % — ABNORMAL HIGH (ref 11.0–14.6)
Retic %: 1.15 % (ref 0.80–1.80)
Retic Ct Abs: 48.76 10*3/uL (ref 34.80–93.90)
WBC: 13.8 10*3/uL — ABNORMAL HIGH (ref 4.0–10.3)
lymph#: 2.8 10*3/uL (ref 0.9–3.3)

## 2016-01-26 LAB — FERRITIN: FERRITIN: 581 ng/mL — AB (ref 22–316)

## 2016-01-26 NOTE — Progress Notes (Signed)
[facility] PROGRESS NOTE Date of Visit: 01/26/2016   Andres Pel, MD Andres Lopez [id number]  DIAGNOSIS: Iron deficiency anemia - Plan: CBC & Diff and Retic, Ferritin, Iron and TIBC  CURRENT TREATMENT: Ferrous sulfate 325 mg once daily  INTERVAL HISTORY:  Andres Lopez 80 y.o. male who was initially referred by Dr. Shelia Lopez for management and evaluation of his chronic anemia on 12/10/2013. Today, he presents to my clinic with his wife. He was last seen by me 6 month ago.   He is doing well overall. His essential tremor is stable. He has been compliant with his iron pill, and a tolerating well. He takes once a day. Denies any abdominal discomfort, melena or hematochezia. He feels well overall.  MEDICAL HISTORY: Past Medical History  Diagnosis Date  . Hypertension   . Essential tremor 01/15/2014    INTERIM HISTORY: has Dehydration; Hypokalemia; Anemia; HTN (hypertension); Leukocytosis; Essential tremor; and Iron deficiency anemia on his problem list.    ALLERGIES:  is allergic to tramadol.  MEDICATIONS:    Medication List       This list is accurate as of: 01/26/16  4:01 PM.  Always use your most recent med list.               amLODipine 5 MG tablet  Commonly known as:  NORVASC  Take 5 mg by mouth daily.     ascorbic acid 500 MG tablet  Commonly known as:  VITAMIN C  Take 500 mg by mouth daily.     aspirin EC 81 MG tablet  Take 81 mg by mouth daily at 6 PM.     atorvastatin 80 MG tablet  Commonly known as:  LIPITOR  Take 40 mg by mouth every other day.     ferrous sulfate 325 (65 FE) MG tablet  Take 325 mg by mouth daily with breakfast. One tablet by mouth daily     losartan-hydrochlorothiazide 100-25 MG tablet  Commonly known as:  HYZAAR  Take 1 tablet by mouth daily. 1/2 tablet daily     mirtazapine 15 MG tablet  Commonly known as:  REMERON  Take 7.5 mg by mouth at bedtime. Reported on  01/26/2016     propranolol ER 120 MG 24 hr capsule  Commonly known as:  INDERAL LA  Take 1 capsule (120 mg total) by mouth daily.     topiramate 50 MG tablet  Commonly known as:  TOPAMAX  1 in am and 1 1/2 pills at night for 2 weeks, then 1 1/2 pills 2 times a day thereafter.        SURGICAL HISTORY:  Past Surgical History  Procedure Laterality Date  . Tumor removal      neck    REVIEW OF SYSTEMS:   Constitutional: Denies fevers, chills or abnormal weight loss Eyes: Denies blurriness of vision Ears, nose, mouth, throat, and face: Denies mucositis or sore throat Respiratory: Denies cough, dyspnea or wheezes Cardiovascular: Denies palpitation, chest discomfort or lower extremity swelling Gastrointestinal:  Denies nausea, heartburn or change in bowel habits Skin: Denies abnormal skin rashes Lymphatics: Denies new lymphadenopathy or easy bruising Neurological:Denies numbness, tingling or new weaknesses Behavioral/Psych: Mood is stable, no new changes  All other systems were reviewed with the patient and are negative.  PHYSICAL EXAMINATION: ECOG PERFORMANCE STATUS: 0  Blood pressure 121/47, pulse 53, temperature 97.9 F (36.6 C), temperature source Oral, resp. rate 16, height 5\' 5"  (1.651 m), weight  172 lb 1.6 oz (78.064 kg), SpO2 98 %.  GENERAL:alert, no distress and comfortable; tremors but otherwise gets to table without difficulty. Mildly overweight.  SKIN: skin color, texture, turgor are normal, no rashes or significant lesions EYES: normal, Conjunctiva are pink and non-injected, sclera clear OROPHARYNX:no exudate, no erythema and lips, buccal mucosa, and tongue normal  NECK: supple, thyroid normal size, non-tender, without nodularity LYMPH:  no palpable lymphadenopathy in the cervical, axillary or supraclavicular LUNGS: clear to auscultation with normal breathing effort, no wheezes or rhonchi HEART: regular rate & rhythm and no murmurs and no lower extremity  edema ABDOMEN:abdomen soft, non-tender and normal bowel sounds Musculoskeletal:no cyanosis of digits and no clubbing  NEURO: alert & oriented x 3 with fluent speech, no focal motor/sensory deficits  Labs:  CBC Latest Ref Rng 01/26/2016 08/11/2015 02/10/2015  WBC 4.0 - 10.3 10e3/uL 13.8(H) 13.9(H) 12.0(H)  Hemoglobin 13.0 - 17.1 g/dL 13.0 12.9(L) 13.0  Hematocrit 38.4 - 49.9 % 39.6 39.0 39.3  Platelets 140 - 400 10e3/uL 304 322 339    CMP Latest Ref Rng 08/11/2015 02/10/2015 08/01/2014  Glucose 70 - 140 mg/dl 106 111 71  BUN 7.0 - 26.0 mg/dL 15.1 15.0 18.5  Creatinine 0.7 - 1.3 mg/dL 1.2 1.1 1.3  Sodium 136 - 145 mEq/L 144 143 142  Potassium 3.5 - 5.1 mEq/L 3.6 3.4(L) 4.2  CO2 22 - 29 mEq/L 20(L) 21(L) 25  Calcium 8.4 - 10.4 mg/dL 9.2 9.0 9.6  Total Protein 6.4 - 8.3 g/dL 7.0 6.9 6.9  Total Bilirubin 0.20 - 1.20 mg/dL 0.34 0.25 0.29  Alkaline Phos 40 - 150 U/L 110 111 92  AST 5 - 34 U/L 13 10 13   ALT 0 - 55 U/L 13 12 18    Results for Andres Lopez (MRN [id number]) as of 01/26/2016 16:02  Ref. Range 02/10/2015 12:55 08/11/2015 13:00 01/26/2016 12:52  Iron Latest Ref Range: 42-163 ug/dL 89 66 62  UIBC Latest Ref Range: 117-376 ug/dL 200 205 217  TIBC Latest Ref Range: 202-409 ug/dL 289 271 279  %SAT Latest Ref Range: 20-55 % 31 24 22   Ferritin Latest Ref Range: 22-316 ng/ml 442 (H) 518 (H) 581 (H)    RADIOGRAPHIC STUDIES:  No new scans    ASSESSMENT: Andres Lopez 80 y.o. male with a history of Iron deficiency anemia - Plan: CBC & Diff and Retic, Ferritin, Iron and TIBC   PLAN:   1. Chronic Anemia NOS, likely iron deficiency -His iron study in 2014 showed normal ferritin at 210, low serum iron and saturation. -His anemia has been gradually improved since he started iron supplement. His hemoglobin today is normal at 13. -His clinical presentation is more consistent with iron deficient anemia --GI work-up in 2014 was unremarkable making a GI bleeding source less likely. Still  he does have a documented history of diverticulosis seen on colonoscopy done on 12/19/2006 due to hemoccult positivity.  -His iron study showed normal serum iron and transferrin saturation, his ferritin level has been trending up, 581 today. -I'll change his ferrous sulfate 1 tab daily to every other day  2. Leukocytosis, chronic and mild.  -His white count is 13.8 today, with predominant neutrophils.  -This has been chronic and stable. Likely reactive. I do not think he needs further workup.   3. Follow-up.  --Follow up in 6 months with CBC,  iron studies and ferritin and symptom visit.  -Change ferrous sulfate from 1 tab daily to every other day -I'll copy my note and the  lab results to his primary care physician Dr. Shelia Lopez   All questions were answered. The patient knows to call the clinic with any problems, questions or concerns. We can certainly see the patient much sooner if necessary.  I spent 15 minutes counseling the patient face to face. The total time spent in the appointment was 20 minutes.   Truitt Merle  01/26/2016

## 2016-01-26 NOTE — Telephone Encounter (Signed)
Gave patient avs report and appointments for October  °

## 2016-01-27 ENCOUNTER — Encounter: Payer: Self-pay | Admitting: *Deleted

## 2016-01-27 ENCOUNTER — Telehealth: Payer: Self-pay | Admitting: *Deleted

## 2016-01-27 NOTE — Telephone Encounter (Signed)
Called pt & informed that serum iron normal & ferritin up per Dr Burr Medico & she would like him to decrease his iron to every other day.  Pt expressed understanding.

## 2016-03-29 DIAGNOSIS — I700 Atherosclerosis of aorta: Secondary | ICD-10-CM | POA: Diagnosis not present

## 2016-03-29 DIAGNOSIS — N400 Enlarged prostate without lower urinary tract symptoms: Secondary | ICD-10-CM | POA: Diagnosis not present

## 2016-03-29 DIAGNOSIS — D631 Anemia in chronic kidney disease: Secondary | ICD-10-CM | POA: Diagnosis not present

## 2016-03-29 DIAGNOSIS — N183 Chronic kidney disease, stage 3 (moderate): Secondary | ICD-10-CM | POA: Diagnosis not present

## 2016-03-29 DIAGNOSIS — I129 Hypertensive chronic kidney disease with stage 1 through stage 4 chronic kidney disease, or unspecified chronic kidney disease: Secondary | ICD-10-CM | POA: Diagnosis not present

## 2016-03-29 DIAGNOSIS — M1990 Unspecified osteoarthritis, unspecified site: Secondary | ICD-10-CM | POA: Diagnosis not present

## 2016-03-29 DIAGNOSIS — E7800 Pure hypercholesterolemia, unspecified: Secondary | ICD-10-CM | POA: Diagnosis not present

## 2016-03-29 DIAGNOSIS — E538 Deficiency of other specified B group vitamins: Secondary | ICD-10-CM | POA: Diagnosis not present

## 2016-03-29 DIAGNOSIS — N2581 Secondary hyperparathyroidism of renal origin: Secondary | ICD-10-CM | POA: Diagnosis not present

## 2016-03-29 DIAGNOSIS — R972 Elevated prostate specific antigen [PSA]: Secondary | ICD-10-CM | POA: Diagnosis not present

## 2016-04-15 DIAGNOSIS — N183 Chronic kidney disease, stage 3 (moderate): Secondary | ICD-10-CM | POA: Diagnosis not present

## 2016-04-15 DIAGNOSIS — I129 Hypertensive chronic kidney disease with stage 1 through stage 4 chronic kidney disease, or unspecified chronic kidney disease: Secondary | ICD-10-CM | POA: Diagnosis not present

## 2016-05-19 ENCOUNTER — Other Ambulatory Visit: Payer: Self-pay

## 2016-05-19 ENCOUNTER — Ambulatory Visit (INDEPENDENT_AMBULATORY_CARE_PROVIDER_SITE_OTHER): Payer: Medicare Other | Admitting: Neurology

## 2016-05-19 ENCOUNTER — Encounter: Payer: Self-pay | Admitting: Neurology

## 2016-05-19 VITALS — BP 118/62 | HR 78 | Resp 16 | Ht 65.0 in

## 2016-05-19 DIAGNOSIS — R251 Tremor, unspecified: Secondary | ICD-10-CM

## 2016-05-19 DIAGNOSIS — G250 Essential tremor: Secondary | ICD-10-CM

## 2016-05-19 DIAGNOSIS — R269 Unspecified abnormalities of gait and mobility: Secondary | ICD-10-CM | POA: Diagnosis not present

## 2016-05-19 MED ORDER — PROPRANOLOL HCL ER 120 MG PO CP24: 120.0000 mg | ORAL_CAPSULE | Freq: Every day | ORAL | 3 refills | Status: DC

## 2016-05-19 MED ORDER — TOPIRAMATE 50 MG PO TABS: 100.0000 mg | ORAL_TABLET | Freq: Two times a day (BID) | ORAL | 3 refills | Status: DC

## 2016-05-19 NOTE — Patient Instructions (Addendum)
We will keep your propranolol the same, we will increase the topiramate to 50 mg 2 pills twice daily.  For your gait problem, I will refer you to physical therapy in [place]. Use your walker at all times.   Follow up in 6 months, sooner if needed.   Drink more water, less tea and coffee.

## 2016-05-19 NOTE — Progress Notes (Signed)
Subjective:    Patient ID: Andres Lopez is a 80 y.o. male.  HPI     Interim history:   Andres Lopez is a very pleasant 80 year old right-handed gentleman with an underlying medical history of hypertension, hyperlipidemia, diverticulitis, BPH with elevated PSA and chronic anemia, who presents for followup consultation of his essential tremor. He is accompanied by his wife today. I last saw him on 11/20/2015, at which time he reported more mood swings, but not necessarily worse since the increase in Topamax. He saw his primary care physician on 11/18/15 and was started on Remeron 15 mg, 1/2 pill each night, which had helped his sleep. His tremors are slightly better as well according to his wife. I suggested we continue with long-acting propranolol 120 mg once daily and Topamax generic 75 mg twice daily.   Today, 05/19/2016: He reports worsening tremor and gait problems. Does not use his walker, has a 2 wheeled walker at the house. Thankfully, no falls. Does not drink enough water, likes tea and coffee.   Previously:    I saw him on 07/21/2015, at which time he reported that his tremor was worse. He had some difficulty initiating steps. He had not fallen recently. He was still trying to stay as active as possible. His tremor would get worse when he was nervous or anxious or angry per wife. He was not always drinking enough water. He likes to drink tea. He had not noticed any new swelling but I did notice some lower extremity swelling during the exam. He had been eating some salty snacks including potato chips and he likes peanut butter and ranch dressing. I asked him to reduce his salty snack intake. I also asked him to reduce his tea intake and increase his water intake and monitor his swelling. I continued him on long-acting Inderal but asked him to increase Topamax from 50 mg twice daily to 75 mg twice daily in 2 gradual increments.    I saw him on 01/17/2015, at which time he reported  exacerbation of tremor with being nervous. His wife reported that he was not drinking enough water. He was not picking up his feet very well. He had no recent falls. He had noted intermittent chest tightness. He noticed this after coming back from a walk and seemed to be particularly worse when the wind was blowing. He denied any shortness of breath or radiating chest pain. Heat pad to his chest was helpful. He had his regular checkup with his primary care physician in January 2016. I suggested we continue with long-acting propranolol and topiramate twice daily. I asked him to drink more water. I advised him to talk to his primary care physician about his episodic chest tightness.   I saw him on 07/19/2014, at which time he felt stable. He had some day-to-day fluctuation of his tremors. I suggested he continue with the same medications, Topamax 50 mg twice daily and Inderal LA 120 mg once daily.   I saw him on 01/15/2014, at which time he reported that his tremor was better and he was tolerating Topamax, 25 mg strength 2 at night. He reported no other changes in his medications or medical history but had an episode of sinus congestion for which he had taken over-the-counter medication. I suggested that he gradually increase the Topamax to 50 mg twice daily in 2 increments. I suggested he continue with Inderal LA 120 mg once daily.   I first met him on 10/09/2013, at which time I  felt he most likely had essential tremor. He does have a family history of Parkinson's disease. He was tried on primidone in the past and had side effects as well as on a beta blocker which stopped working. I suggested a trial of Trokendi XR, but the patient's insurance did not cover it and he could not afford it. I switched him to generic Topamax. I checked a TSH, which was normal.   He has had a chronic upper extremity tremor for the past 6 years. He was treated with primidone some 3 years ago, by Dr. Justine Null in [facility] and has  been seeing him for the past 3 years. Primidone caused side effects, including depression and personality changes per wife. He has been on Inderal LA, which helped in the beginning, but then stopped working. He was on it for about 2 years, also through Dr. Justine Null. He had not seen him in years. His mother had a hand and head tremor and a diagnosis of parkinson's disease. Looking back, he recalls, he had a intermittent hand tremor when he was in his 20s, but it improved or stabilized. He does not drink alcohol. He does not have a head or voice tremor, and no other family member with tremor. He denies balance problems and has not fallen. He has a more tremulous handwriting. He does not have glaucoma or kidney stones and does have some hearing loss. He had noise exposure at work. He worked at [employer] for 35 years. He quit smoking in '77.     His Past Medical History Is Significant For: Past Medical History:  Diagnosis Date  . Essential tremor 01/15/2014  . Hypertension     His Past Surgical History Is Significant For: Past Surgical History:  Procedure Laterality Date  . TUMOR REMOVAL     neck    His Family History Is Significant For: Family History  Problem Relation Age of Onset  . Parkinsonism Mother     His Social History Is Significant For: Social History   Social History  . Marital status: Married    Spouse name: Everlene Farrier  . Number of children: 3  . Years of education: 12   Occupational History  .      retired   Social History Main Topics  . Smoking status: Former Research scientist (life sciences)  . Smokeless tobacco: Never Used  . Alcohol use No  . Drug use: No  . Sexual activity: No   Other Topics Concern  . None   Social History Narrative   Patient is right handed,resides with wife    His Allergies Are:  Allergies  Allergen Reactions  . Tramadol Other (See Comments)    "makes him crazy"  :  His Current Medications Are:  Outpatient Encounter Prescriptions as of 05/19/2016   Medication Sig  . amLODipine (NORVASC) 5 MG tablet Take 5 mg by mouth daily.  Marland Kitchen ascorbic acid (VITAMIN C) 500 MG tablet Take 500 mg by mouth daily.  Marland Kitchen aspirin EC 81 MG tablet Take 81 mg by mouth daily at 6 PM.  . atorvastatin (LIPITOR) 80 MG tablet Take 40 mg by mouth every other day.   . ferrous sulfate 325 (65 FE) MG tablet Take 325 mg by mouth every other day. One tablet by mouth every other day 01/27/16  . furosemide (LASIX) 40 MG tablet Take 40 mg by mouth.  . losartan-hydrochlorothiazide (HYZAAR) 100-25 MG per tablet Take 1 tablet by mouth daily. 1/2 tablet daily  . mirtazapine (REMERON) 15 MG  tablet Take 7.5 mg by mouth at bedtime. Reported on 01/26/2016  . propranolol ER (INDERAL LA) 120 MG 24 hr capsule Take 1 capsule (120 mg total) by mouth daily.  Marland Kitchen topiramate (TOPAMAX) 50 MG tablet 1 in am and 1 1/2 pills at night for 2 weeks, then 1 1/2 pills 2 times a day thereafter.   No facility-administered encounter medications on file as of 05/19/2016.   :  Review of Systems:  Out of a complete 14 point review of systems, all are reviewed and negative with the exception of these symptoms as listed below: Review of Systems  Neurological:       Patient reports that he has good days and bad days with his tremors. Today is a "bad day". Patient would like to discuss his walking. Has bursitis in his hip. Unknown which problem is causing trouble with walking. He is interested in PT.     Objective:  Neurologic Exam  Physical Exam Physical Examination:   Vitals:   05/19/16 1406  BP: 118/62  Pulse: 78  Resp: 16    General Examination: The patient is a very pleasant 80 y.o. male in no acute distress. He appears well-developed and well-nourished and well groomed. He is close to being tearful during this appointment.  HEENT: Normocephalic, atraumatic, pupils are equal, round and reactive to light and accommodation. Funduscopic exam is normal with sharp disc margins noted. Extraocular tracking  is good without limitation to gaze excursion or nystagmus noted. Normal smooth pursuit is noted. Hearing is impaired. Face is symmetric with normal facial animation and normal facial sensation. Speech is clear with no dysarthria noted. There is no hypophonia. There is a mild lower lip and jaw tremor, slight voice tremor. Neck is perhaps mildly rigid, with full range of passive and active motion. There are no carotid bruits on auscultation. Oropharynx exam reveals: mild to moderate mouth dryness, adequate dental hygiene with dentures in place and mild airway crowding, due to narrow airway entry. Mallampati is class II. Tongue protrudes centrally and palate elevates symmetrically. Tonsils are 1+.    Chest: Clear to auscultation without wheezing, rhonchi or crackles noted.  Heart: S1+S2+0, regular and normal without murmurs, rubs or gallops noted.   Abdomen: Soft, non-tender and non-distended with normal bowel sounds appreciated on auscultation.  Extremities: There is trace edema around both ankles. Pedal pulses are intact.  Skin: Warm and dry without trophic changes noted. There are no varicose veins.  Musculoskeletal: exam reveals no obvious joint deformities, tenderness or joint swelling or erythema.   Neurologically:  Mental status: The patient is awake, alert and oriented in all 4 spheres. His memory, attention, language and knowledge are appropriate. There is no aphasia, agnosia, apraxia or anomia. Speech is clear with normal prosody and enunciation. Thought process is linear. Mood is congruent and affect is normal.  Cranial nerves are as described above under HEENT exam. In addition, shoulder shrug is normal with equal shoulder height noted. Motor exam: Normal bulk, strength and tone is noted. There is no drift, or rebound. There is a moderate upper extremity tremor, resting, action and postural, constant, fine motor skills mild to moderately impaired throughout, reflexes 1+, Romberg not  testable today, he is not able to stand narrow based. Sensory exam is intact to light touch in the upper and lower extremities.  Gait, station and balance: He stands up with moderate difficulty and has to push himself up. He is able to walk very little today, has stutter steps, difficulty  turning, difficulty picking up his feet bilaterally. Balance is impaired, gait imbalance clearly worse from last time.   Assessment and Plan:   In summary, Jaquay Posthumus is a very pleasant 80 year old male with an underlying medical history of hypertension, hyperlipidemia, diverticulitis, BPH with elevated PSA, whoHas a long-standing history of tremors affecting both upper extremities and now also his lower jaw and lip area and voice, symptoms have progressed. He has had more difficulty with his walking and balance, thankfully has not fallen, I suggested we refer him to physical therapy, he would prefer to go to local in [place]. We will fax the referral to a physical therapy location of his choice. In addition, I suggested we increase his Topamax 200 mg twice daily, he is advised to use his walker at all times, decrease his caffeine intake which includes tea and coffee and increase his water intake.  He has not had any telltale parkinsonian signs, but has a family history of Parkinson's disease. In the past he was treated with Mysoline by his previous neurologist in [facility].  We will continue to monitor his tremors and his symptoms. I refilled his prescriptions today and adjusted his Topamax prescription with a higher dose. I would like to see him back in 6 months, sooner as needed. I answered all their questions today and the patient and his wife were in agreement. I spent 25 minutes in total face-to-face time with the patient, more than 50% of which was spent in counseling and coordination of care, reviewing test results, reviewing medication and discussing or reviewing the diagnosis of ET, and gait d/o, the  prognosis and treatment options.

## 2016-05-26 DIAGNOSIS — R2689 Other abnormalities of gait and mobility: Secondary | ICD-10-CM | POA: Diagnosis not present

## 2016-05-26 DIAGNOSIS — M6281 Muscle weakness (generalized): Secondary | ICD-10-CM | POA: Diagnosis not present

## 2016-06-01 DIAGNOSIS — R2689 Other abnormalities of gait and mobility: Secondary | ICD-10-CM | POA: Diagnosis not present

## 2016-06-01 DIAGNOSIS — M6281 Muscle weakness (generalized): Secondary | ICD-10-CM | POA: Diagnosis not present

## 2016-06-04 DIAGNOSIS — M6281 Muscle weakness (generalized): Secondary | ICD-10-CM | POA: Diagnosis not present

## 2016-06-04 DIAGNOSIS — R2689 Other abnormalities of gait and mobility: Secondary | ICD-10-CM | POA: Diagnosis not present

## 2016-06-09 DIAGNOSIS — M6281 Muscle weakness (generalized): Secondary | ICD-10-CM | POA: Diagnosis not present

## 2016-06-09 DIAGNOSIS — R2689 Other abnormalities of gait and mobility: Secondary | ICD-10-CM | POA: Diagnosis not present

## 2016-06-11 DIAGNOSIS — R2689 Other abnormalities of gait and mobility: Secondary | ICD-10-CM | POA: Diagnosis not present

## 2016-06-11 DIAGNOSIS — M6281 Muscle weakness (generalized): Secondary | ICD-10-CM | POA: Diagnosis not present

## 2016-06-14 DIAGNOSIS — M6281 Muscle weakness (generalized): Secondary | ICD-10-CM | POA: Diagnosis not present

## 2016-06-14 DIAGNOSIS — R2689 Other abnormalities of gait and mobility: Secondary | ICD-10-CM | POA: Diagnosis not present

## 2016-06-16 DIAGNOSIS — M6281 Muscle weakness (generalized): Secondary | ICD-10-CM | POA: Diagnosis not present

## 2016-06-16 DIAGNOSIS — R2689 Other abnormalities of gait and mobility: Secondary | ICD-10-CM | POA: Diagnosis not present

## 2016-06-21 DIAGNOSIS — M6281 Muscle weakness (generalized): Secondary | ICD-10-CM | POA: Diagnosis not present

## 2016-06-21 DIAGNOSIS — R2689 Other abnormalities of gait and mobility: Secondary | ICD-10-CM | POA: Diagnosis not present

## 2016-06-24 DIAGNOSIS — R2689 Other abnormalities of gait and mobility: Secondary | ICD-10-CM | POA: Diagnosis not present

## 2016-06-24 DIAGNOSIS — M6281 Muscle weakness (generalized): Secondary | ICD-10-CM | POA: Diagnosis not present

## 2016-06-30 DIAGNOSIS — R600 Localized edema: Secondary | ICD-10-CM | POA: Diagnosis not present

## 2016-06-30 DIAGNOSIS — R32 Unspecified urinary incontinence: Secondary | ICD-10-CM | POA: Diagnosis not present

## 2016-06-30 DIAGNOSIS — Z23 Encounter for immunization: Secondary | ICD-10-CM | POA: Diagnosis not present

## 2016-06-30 DIAGNOSIS — R159 Full incontinence of feces: Secondary | ICD-10-CM | POA: Diagnosis not present

## 2016-06-30 DIAGNOSIS — D72829 Elevated white blood cell count, unspecified: Secondary | ICD-10-CM | POA: Diagnosis not present

## 2016-07-01 DIAGNOSIS — M6281 Muscle weakness (generalized): Secondary | ICD-10-CM | POA: Diagnosis not present

## 2016-07-01 DIAGNOSIS — R2689 Other abnormalities of gait and mobility: Secondary | ICD-10-CM | POA: Diagnosis not present

## 2016-07-05 DIAGNOSIS — R2689 Other abnormalities of gait and mobility: Secondary | ICD-10-CM | POA: Diagnosis not present

## 2016-07-05 DIAGNOSIS — M6281 Muscle weakness (generalized): Secondary | ICD-10-CM | POA: Diagnosis not present

## 2016-07-06 DIAGNOSIS — R600 Localized edema: Secondary | ICD-10-CM | POA: Diagnosis not present

## 2016-07-08 DIAGNOSIS — I10 Essential (primary) hypertension: Secondary | ICD-10-CM | POA: Diagnosis not present

## 2016-07-12 DIAGNOSIS — M6281 Muscle weakness (generalized): Secondary | ICD-10-CM | POA: Diagnosis not present

## 2016-07-12 DIAGNOSIS — R2689 Other abnormalities of gait and mobility: Secondary | ICD-10-CM | POA: Diagnosis not present

## 2016-07-15 DIAGNOSIS — M6281 Muscle weakness (generalized): Secondary | ICD-10-CM | POA: Diagnosis not present

## 2016-07-15 DIAGNOSIS — R2689 Other abnormalities of gait and mobility: Secondary | ICD-10-CM | POA: Diagnosis not present

## 2016-07-19 DIAGNOSIS — R2689 Other abnormalities of gait and mobility: Secondary | ICD-10-CM | POA: Diagnosis not present

## 2016-07-19 DIAGNOSIS — M6281 Muscle weakness (generalized): Secondary | ICD-10-CM | POA: Diagnosis not present

## 2016-07-22 DIAGNOSIS — R159 Full incontinence of feces: Secondary | ICD-10-CM | POA: Diagnosis not present

## 2016-07-26 ENCOUNTER — Other Ambulatory Visit (HOSPITAL_BASED_OUTPATIENT_CLINIC_OR_DEPARTMENT_OTHER): Payer: Medicare Other

## 2016-07-26 ENCOUNTER — Ambulatory Visit (HOSPITAL_BASED_OUTPATIENT_CLINIC_OR_DEPARTMENT_OTHER): Payer: Medicare Other | Admitting: Hematology

## 2016-07-26 ENCOUNTER — Telehealth: Payer: Self-pay | Admitting: Hematology

## 2016-07-26 ENCOUNTER — Encounter: Payer: Self-pay | Admitting: Hematology

## 2016-07-26 VITALS — BP 127/75 | HR 57 | Temp 98.3°F | Resp 18 | Ht 65.0 in | Wt 175.5 lb

## 2016-07-26 DIAGNOSIS — D72829 Elevated white blood cell count, unspecified: Secondary | ICD-10-CM

## 2016-07-26 DIAGNOSIS — D509 Iron deficiency anemia, unspecified: Secondary | ICD-10-CM

## 2016-07-26 LAB — CBC & DIFF AND RETIC
BASO%: 0.3 % (ref 0.0–2.0)
Basophils Absolute: 0.1 10*3/uL (ref 0.0–0.1)
EOS%: 2.1 % (ref 0.0–7.0)
Eosinophils Absolute: 0.3 10*3/uL (ref 0.0–0.5)
HCT: 38.6 % (ref 38.4–49.9)
HEMOGLOBIN: 12.7 g/dL — AB (ref 13.0–17.1)
Immature Retic Fract: 11.1 % — ABNORMAL HIGH (ref 3.00–10.60)
LYMPH%: 22.3 % (ref 14.0–49.0)
MCH: 31.6 pg (ref 27.2–33.4)
MCHC: 32.9 g/dL (ref 32.0–36.0)
MCV: 96 fL (ref 79.3–98.0)
MONO#: 1.2 10*3/uL — ABNORMAL HIGH (ref 0.1–0.9)
MONO%: 8.5 % (ref 0.0–14.0)
NEUT%: 66.8 % (ref 39.0–75.0)
NEUTROS ABS: 9.6 10*3/uL — AB (ref 1.5–6.5)
PLATELETS: 261 10*3/uL (ref 140–400)
RBC: 4.02 10*6/uL — AB (ref 4.20–5.82)
RDW: 14.8 % — ABNORMAL HIGH (ref 11.0–14.6)
Retic %: 1.91 % — ABNORMAL HIGH (ref 0.80–1.80)
Retic Ct Abs: 76.78 10*3/uL (ref 34.80–93.90)
WBC: 14.3 10*3/uL — AB (ref 4.0–10.3)
lymph#: 3.2 10*3/uL (ref 0.9–3.3)

## 2016-07-26 LAB — IRON AND TIBC
%SAT: 20 % (ref 20–55)
IRON: 60 ug/dL (ref 42–163)
TIBC: 298 ug/dL (ref 202–409)
UIBC: 238 ug/dL (ref 117–376)

## 2016-07-26 LAB — FERRITIN: Ferritin: 463 ng/ml — ABNORMAL HIGH (ref 22–316)

## 2016-07-26 NOTE — Telephone Encounter (Signed)
Avs report and appointment schedule given to patient per 07/26/16 los. °

## 2016-07-26 NOTE — Progress Notes (Signed)
[facility] PROGRESS NOTE Date of Visit: 07/26/2016   Andres Pel, MD [address] [place] [address]  DIAGNOSIS: Iron deficiency anemia, unspecified iron deficiency anemia type  CURRENT TREATMENT: Ferrous sulfate 325 mg once every other daily  INTERVAL HISTORY:  Andres Lopez 80 y.o. male who was initially referred by Dr. Shelia Media for management and evaluation of his chronic anemia on 12/10/2013. Today, he presents to my clinic with his wife. He was last seen by me 6 month ago.   He is doing well moderately well at home. He sometimes is able to mow his lawn, but his physical activity overall is limited due to his tremor. His appetite and energy level are moderate and stable. No other new complaints. He is taking iron pill once every other day, and tolerating well.  MEDICAL HISTORY: Past Medical History:  Diagnosis Date  . Essential tremor 01/15/2014  . Hypertension     INTERIM HISTORY: has Dehydration; Hypokalemia; Anemia; HTN (hypertension); Leukocytosis; Essential tremor; and Iron deficiency anemia on his problem list.    ALLERGIES:  is allergic to tramadol.  MEDICATIONS:    Medication List       Accurate as of 07/26/16  1:50 PM. Always use your most recent med list.          ascorbic acid 500 MG tablet Commonly known as:  VITAMIN C Take 500 mg by mouth daily.   aspirin EC 81 MG tablet Take 81 mg by mouth daily at 6 PM.   atorvastatin 80 MG tablet Commonly known as:  LIPITOR Take 40 mg by mouth every other day.   ferrous sulfate 325 (65 FE) MG tablet Take 325 mg by mouth every other day. One tablet by mouth every other day 01/27/16   furosemide 40 MG tablet Commonly known as:  LASIX Take 40 mg by mouth. Takes prn   losartan-hydrochlorothiazide 100-25 MG tablet Commonly known as:  HYZAAR Take 1 tablet by mouth daily. 1/2 tablet daily   propranolol ER 120 MG 24 hr capsule Commonly known as:  INDERAL  LA Take 1 capsule (120 mg total) by mouth daily.   topiramate 50 MG tablet Commonly known as:  TOPAMAX Take 2 tablets (100 mg total) by mouth 2 (two) times daily.       SURGICAL HISTORY:  Past Surgical History:  Procedure Laterality Date  . TUMOR REMOVAL     neck    REVIEW OF SYSTEMS:   Constitutional: Denies fevers, chills or abnormal weight loss Eyes: Denies blurriness of vision Ears, nose, mouth, throat, and face: Denies mucositis or sore throat Respiratory: Denies cough, dyspnea or wheezes Cardiovascular: Denies palpitation, chest discomfort or lower extremity swelling Gastrointestinal:  Denies nausea, heartburn or change in bowel habits Skin: Denies abnormal skin rashes Lymphatics: Denies new lymphadenopathy or easy bruising Neurological:Denies numbness, tingling or new weaknesses Behavioral/Psych: Mood is stable, no new changes  All other systems were reviewed with the patient and are negative.  PHYSICAL EXAMINATION: ECOG PERFORMANCE STATUS: 0  Blood pressure 127/75, pulse (!) 57, temperature 98.3 F (36.8 C), temperature source Oral, resp. rate 18, height 5\' 5"  (1.651 m), weight 175 lb 8 oz (79.6 kg), SpO2 100 %.  GENERAL:alert, no distress and comfortable; tremors but otherwise gets to table without difficulty. Mildly overweight.  SKIN: skin color, texture, turgor are normal, no rashes or significant lesions EYES: normal, Conjunctiva are pink and non-injected, sclera clear OROPHARYNX:no exudate, no erythema and lips, buccal mucosa, and tongue normal  NECK: supple, thyroid normal size, non-tender, without nodularity LYMPH:  no palpable lymphadenopathy in the cervical, axillary or supraclavicular LUNGS: clear to auscultation with normal breathing effort, no wheezes or rhonchi HEART: regular rate & rhythm and no murmurs and no lower extremity edema ABDOMEN:abdomen soft, non-tender and normal bowel sounds Musculoskeletal:no cyanosis of digits and no clubbing  NEURO:  alert & oriented x 3 with fluent speech, no focal motor/sensory deficits  Labs:  CBC Latest Ref Rng & Units 07/26/2016 01/26/2016 08/11/2015  WBC 4.0 - 10.3 10e3/uL 14.3(H) 13.8(H) 13.9(H)  Hemoglobin 13.0 - 17.1 g/dL 12.7(L) 13.0 12.9(L)  Hematocrit 38.4 - 49.9 % 38.6 39.6 39.0  Platelets 140 - 400 10e3/uL 261 304 322    CMP Latest Ref Rng & Units 08/11/2015 02/10/2015 08/01/2014  Glucose 70 - 140 mg/dl 106 111 71  BUN 7.0 - 26.0 mg/dL 15.1 15.0 18.5  Creatinine 0.7 - 1.3 mg/dL 1.2 1.1 1.3  Sodium 136 - 145 mEq/L 144 143 142  Potassium 3.5 - 5.1 mEq/L 3.6 3.4(L) 4.2  Chloride 96 - 112 mEq/L - - -  CO2 22 - 29 mEq/L 20(L) 21(L) 25  Calcium 8.4 - 10.4 mg/dL 9.2 9.0 9.6  Total Protein 6.4 - 8.3 g/dL 7.0 6.9 6.9  Total Bilirubin 0.20 - 1.20 mg/dL 0.34 0.25 0.29  Alkaline Phos 40 - 150 U/L 110 111 92  AST 5 - 34 U/L 13 10 13   ALT 0 - 55 U/L 13 12 18    Results for Andres Lopez, Andres Lopez (MRN [id number]) as of 01/26/2016 16:02  Ref. Range 02/10/2015 12:55 08/11/2015 13:00 01/26/2016 12:52  Iron Latest Ref Range: 42-163 ug/dL 89 66 62  UIBC Latest Ref Range: 117-376 ug/dL 200 205 217  TIBC Latest Ref Range: 202-409 ug/dL 289 271 279  %SAT Latest Ref Range: 20-55 % 31 24 22   Ferritin Latest Ref Range: 22-316 ng/ml 442 (H) 518 (H) 581 (H)    RADIOGRAPHIC STUDIES:  No new scans    ASSESSMENT: Andres Lopez 80 y.o. male with a history of Iron deficiency anemia, unspecified iron deficiency anemia type   PLAN:   1. Chronic Anemia NOS, likely iron deficiency -His iron study in 2014 showed normal ferritin at 210, low serum iron and saturation. -His anemia has been gradually improved since he started iron supplement. His hemoglobin today is normal at 13. -His clinical presentation is more consistent with iron deficient anemia --GI work-up in 2014 was unremarkable making a GI bleeding source less likely. Still he does have a documented history of diverticulosis seen on colonoscopy done on 12/19/2006  due to hemoccult positivity.  -His iron study showed normal serum iron and transferrin saturation, his ferritin level was elevated at 581 in 01/2016, and l changed his ferrous sulfate 1 tab daily to every other day -His iron study results are still pending today, I'll call him tomorrow -We'll continue ferrous sulfate  2. Leukocytosis, chronic and mild.  -His white count is 13.8 today, with predominant neutrophils.  -This has been chronic and stable. Likely reactive. I do not think he needs further workup.   3. Follow-up.  -I'll see him back in one year -He will also follow-up with Dr. Shelia Media and his neurologist  All questions were answered. The patient knows to call the clinic with any problems, questions or concerns. We can certainly see the patient much sooner if necessary.  I spent 15 minutes counseling the patient face to face. The total time spent in the appointment was 20 minutes.  Truitt Merle  07/26/2016

## 2016-07-28 ENCOUNTER — Telehealth: Payer: Self-pay | Admitting: *Deleted

## 2016-07-28 DIAGNOSIS — M6281 Muscle weakness (generalized): Secondary | ICD-10-CM | POA: Diagnosis not present

## 2016-07-28 DIAGNOSIS — R2689 Other abnormalities of gait and mobility: Secondary | ICD-10-CM | POA: Diagnosis not present

## 2016-07-28 NOTE — Telephone Encounter (Signed)
-----   Message from Truitt Merle, MD sent at 07/27/2016  8:16 AM EDT ----- Please let pt know his iron study result (normal iron and saturation, slightly elevated ferritin) and continue the current iron supplement dose.   Thanks  Truitt Merle  07/27/2016

## 2016-07-28 NOTE — Telephone Encounter (Signed)
Called pt & informed of iron study result & encouraged to cont current iron supplement dose of ferrous sulfate 325 mg every other day.

## 2016-08-02 DIAGNOSIS — R600 Localized edema: Secondary | ICD-10-CM | POA: Diagnosis not present

## 2016-08-02 DIAGNOSIS — Z6829 Body mass index (BMI) 29.0-29.9, adult: Secondary | ICD-10-CM | POA: Diagnosis not present

## 2016-08-02 DIAGNOSIS — I10 Essential (primary) hypertension: Secondary | ICD-10-CM | POA: Diagnosis not present

## 2016-10-05 DIAGNOSIS — E669 Obesity, unspecified: Secondary | ICD-10-CM | POA: Diagnosis not present

## 2016-10-05 DIAGNOSIS — E7800 Pure hypercholesterolemia, unspecified: Secondary | ICD-10-CM | POA: Diagnosis not present

## 2016-10-05 DIAGNOSIS — R972 Elevated prostate specific antigen [PSA]: Secondary | ICD-10-CM | POA: Diagnosis not present

## 2016-10-05 DIAGNOSIS — Z86010 Personal history of colonic polyps: Secondary | ICD-10-CM | POA: Diagnosis not present

## 2016-10-05 DIAGNOSIS — N2581 Secondary hyperparathyroidism of renal origin: Secondary | ICD-10-CM | POA: Diagnosis not present

## 2016-10-05 DIAGNOSIS — D631 Anemia in chronic kidney disease: Secondary | ICD-10-CM | POA: Diagnosis not present

## 2016-10-05 DIAGNOSIS — I129 Hypertensive chronic kidney disease with stage 1 through stage 4 chronic kidney disease, or unspecified chronic kidney disease: Secondary | ICD-10-CM | POA: Diagnosis not present

## 2016-10-05 DIAGNOSIS — N390 Urinary tract infection, site not specified: Secondary | ICD-10-CM | POA: Diagnosis not present

## 2016-10-05 DIAGNOSIS — R251 Tremor, unspecified: Secondary | ICD-10-CM | POA: Diagnosis not present

## 2016-10-05 DIAGNOSIS — N183 Chronic kidney disease, stage 3 (moderate): Secondary | ICD-10-CM | POA: Diagnosis not present

## 2016-10-05 DIAGNOSIS — E538 Deficiency of other specified B group vitamins: Secondary | ICD-10-CM | POA: Diagnosis not present

## 2016-10-05 DIAGNOSIS — M1990 Unspecified osteoarthritis, unspecified site: Secondary | ICD-10-CM | POA: Diagnosis not present

## 2016-10-05 DIAGNOSIS — I700 Atherosclerosis of aorta: Secondary | ICD-10-CM | POA: Diagnosis not present

## 2016-10-25 DIAGNOSIS — N183 Chronic kidney disease, stage 3 (moderate): Secondary | ICD-10-CM | POA: Diagnosis not present

## 2016-11-01 DIAGNOSIS — N183 Chronic kidney disease, stage 3 (moderate): Secondary | ICD-10-CM | POA: Diagnosis not present

## 2016-11-01 DIAGNOSIS — N390 Urinary tract infection, site not specified: Secondary | ICD-10-CM | POA: Diagnosis not present

## 2016-11-18 DIAGNOSIS — I129 Hypertensive chronic kidney disease with stage 1 through stage 4 chronic kidney disease, or unspecified chronic kidney disease: Secondary | ICD-10-CM | POA: Diagnosis not present

## 2016-11-18 DIAGNOSIS — Z125 Encounter for screening for malignant neoplasm of prostate: Secondary | ICD-10-CM | POA: Diagnosis not present

## 2016-11-18 DIAGNOSIS — Z7982 Long term (current) use of aspirin: Secondary | ICD-10-CM | POA: Diagnosis not present

## 2016-11-18 DIAGNOSIS — Z0000 Encounter for general adult medical examination without abnormal findings: Secondary | ICD-10-CM | POA: Diagnosis not present

## 2016-11-18 DIAGNOSIS — N189 Chronic kidney disease, unspecified: Secondary | ICD-10-CM | POA: Diagnosis not present

## 2016-11-18 DIAGNOSIS — I10 Essential (primary) hypertension: Secondary | ICD-10-CM | POA: Diagnosis not present

## 2016-11-18 DIAGNOSIS — N390 Urinary tract infection, site not specified: Secondary | ICD-10-CM | POA: Diagnosis not present

## 2016-11-23 ENCOUNTER — Ambulatory Visit (INDEPENDENT_AMBULATORY_CARE_PROVIDER_SITE_OTHER): Payer: Medicare Other | Admitting: Neurology

## 2016-11-23 ENCOUNTER — Encounter: Payer: Self-pay | Admitting: Neurology

## 2016-11-23 VITALS — BP 136/64 | HR 68 | Resp 14 | Ht 65.0 in | Wt 165.0 lb

## 2016-11-23 DIAGNOSIS — G250 Essential tremor: Secondary | ICD-10-CM

## 2016-11-23 DIAGNOSIS — R269 Unspecified abnormalities of gait and mobility: Secondary | ICD-10-CM

## 2016-11-23 NOTE — Progress Notes (Signed)
Subjective:    Patient ID: Andres Lopez is a 81 y.o. male.  HPI     Interim history:   Mr. Andres Lopez is a very pleasant 81 year old right-handed gentleman with an underlying medical history of hypertension, hyperlipidemia, diverticulitis, BPH with elevated PSA and chronic anemia, who presents for followup consultation of his essential tremor. He is accompanied by his wife again today. I last saw him on 05/19/2016, at which time he reported worsening tremor and gait problems. He was not using his walker. He had a 2 wheeled walker at the house. He had thankfully no falls. He was not drinking enough water. I increased his Topamax to ort 100 mg twice daily and kept his Inderal LA at 120 mg once daily.   Today, 11/23/2016 (all dictated new, as well as above notes, some dictation done in note pad or Word, outside of chart, may appear as copied):  He reports, some days are better than others, no falls, using walker at times, not always. Drives too fast at times, per wife. Some difficulty with ADLs, has had intermittent depressed symptoms, nothing sustained. They do have a gun in the house, not loaded, no SI currently. Had a Rx for depression, but cannot recall the name, but per wife, it is Zoloft 50 mg. overall, his tremor has been progressive. Thankfully no recent falls, does not always drink enough water.  The patient's allergies, current medications, family history, past medical history, past social history, past surgical history and problem list were reviewed and updated as appropriate.   Previously (copied from previous notes for reference):   I saw him on 11/20/2015, at which time he reported more mood swings, but not necessarily worse since the increase in Topamax. He saw his primary care physician on 11/18/15 and was started on Remeron 15 mg, 1/2 pill each night, which had helped his sleep. His tremors are slightly better as well according to his wife. I suggested we continue with long-acting  propranolol 120 mg once daily and Topamax generic 75 mg twice daily.    I saw him on 07/21/2015, at which time he reported that his tremor was worse. He had some difficulty initiating steps. He had not fallen recently. He was still trying to stay as active as possible. His tremor would get worse when he was nervous or anxious or angry per wife. He was not always drinking enough water. He likes to drink tea. He had not noticed any new swelling but I did notice some lower extremity swelling during the exam. He had been eating some salty snacks including potato chips and he likes peanut butter and ranch dressing. I asked him to reduce his salty snack intake. I also asked him to reduce his tea intake and increase his water intake and monitor his swelling. I continued him on long-acting Inderal but asked him to increase Topamax from 50 mg twice daily to 75 mg twice daily in 2 gradual increments.    I saw him on 01/17/2015, at which time he reported exacerbation of tremor with being nervous. His wife reported that he was not drinking enough water. He was not picking up his feet very well. He had no recent falls. He had noted intermittent chest tightness. He noticed this after coming back from a walk and seemed to be particularly worse when the wind was blowing. He denied any shortness of breath or radiating chest pain. Heat pad to his chest was helpful. He had his regular checkup with his primary care physician  in January 2016. I suggested we continue with long-acting propranolol and topiramate twice daily. I asked him to drink more water. I advised him to talk to his primary care physician about his episodic chest tightness.   I saw him on 07/19/2014, at which time he felt stable. He had some day-to-day fluctuation of his tremors. I suggested he continue with the same medications, Topamax 50 mg twice daily and Inderal LA 120 mg once daily.   I saw him on 01/15/2014, at which time he reported that his tremor was  better and he was tolerating Topamax, 25 mg strength 2 at night. He reported no other changes in his medications or medical history but had an episode of sinus congestion for which he had taken over-the-counter medication. I suggested that he gradually increase the Topamax to 50 mg twice daily in 2 increments. I suggested he continue with Inderal LA 120 mg once daily.   I first met him on 10/09/2013, at which time I felt he most likely had essential tremor. He does have a family history of Parkinson's disease. He was tried on primidone in the past and had side effects as well as on a beta blocker which stopped working. I suggested a trial of Trokendi XR, but the patient's insurance did not cover it and he could not afford it. I switched him to generic Topamax. I checked a TSH, which was normal.   He has had a chronic upper extremity tremor for the past 6 years. He was treated with primidone some 3 years ago, by Dr. Justine Null in [facility] and has been seeing him for the past 3 years. Primidone caused side effects, including depression and personality changes per wife. He has been on Inderal LA, which helped in the beginning, but then stopped working. He was on it for about 2 years, also through Dr. Justine Null. He had not seen him in years. His mother had a hand and head tremor and a diagnosis of parkinson's disease. Looking back, he recalls, he had a intermittent hand tremor when he was in his 20s, but it improved or stabilized. He does not drink alcohol. He does not have a head or voice tremor, and no other family member with tremor. He denies balance problems and has not fallen. He has a more tremulous handwriting. He does not have glaucoma or kidney stones and does have some hearing loss. He had noise exposure at work. He worked at [employer] for 35 years. He quit smoking in '77.    His Past Medical History Is Significant For: Past Medical History:  Diagnosis Date  . Essential tremor 01/15/2014  .  Hypertension     His Past Surgical History Is Significant For: Past Surgical History:  Procedure Laterality Date  . TUMOR REMOVAL     neck    His Family History Is Significant For: Family History  Problem Relation Age of Onset  . Parkinsonism Mother     His Social History Is Significant For: Social History   Social History  . Marital status: Married    Spouse name: Everlene Farrier  . Number of children: 3  . Years of education: 12   Occupational History  .      retired   Social History Main Topics  . Smoking status: Former Research scientist (life sciences)  . Smokeless tobacco: Never Used  . Alcohol use No  . Drug use: No  . Sexual activity: No   Other Topics Concern  . None   Social History Narrative  Patient is right handed,resides with wife    His Allergies Are:  Allergies  Allergen Reactions  . Tramadol Other (See Comments)    "makes him crazy"  :   His Current Medications Are:  Outpatient Encounter Prescriptions as of 11/23/2016  Medication Sig  . ascorbic acid (VITAMIN C) 500 MG tablet Take 500 mg by mouth daily.  Marland Kitchen aspirin EC 81 MG tablet Take 81 mg by mouth daily at 6 PM.  . atorvastatin (LIPITOR) 80 MG tablet Take 40 mg by mouth every other day.   . ferrous sulfate 325 (65 FE) MG tablet Take 325 mg by mouth every other day. One tablet by mouth every other day 01/27/16  . furosemide (LASIX) 40 MG tablet Take 40 mg by mouth. Takes prn  . losartan-hydrochlorothiazide (HYZAAR) 100-25 MG per tablet Take 1 tablet by mouth daily. 1/2 tablet daily  . propranolol ER (INDERAL LA) 120 MG 24 hr capsule Take 1 capsule (120 mg total) by mouth daily.  Marland Kitchen topiramate (TOPAMAX) 50 MG tablet Take 2 tablets (100 mg total) by mouth 2 (two) times daily.   No facility-administered encounter medications on file as of 11/23/2016.   :  Review of Systems:  Out of a complete 14 point review of systems, all are reviewed and negative with the exception of these symptoms as listed below: Review of Systems   Neurological:       Patient states that he feels that his tremors are worse. Thinks that the cold weather may affect this.   He is taking Topiramate 156m BID.     Objective:  Neurologic Exam  Physical Exam Physical Examination:   Vitals:   11/23/16 1406  BP: 136/64  Pulse: 68  Resp: 14   General Examination: The patient is a very pleasant 81y.o. male in no acute distress. He appears well-developed and well-nourished and well groomed.   HEENT: Normocephalic, atraumatic, pupils are equal, round and reactive to light and accommodation. S/p cataract repairs b/l. Funduscopic exam is normal with sharp disc margins noted. Extraocular tracking is good without limitation to gaze excursion or nystagmus noted. Normal smooth pursuit is noted. Hearing is very impaired. Face is symmetric with normal facial animation and normal facial sensation. Speech is clear with no dysarthria noted. There is no hypophonia. There is a mild lip, and lower jaw tremor, mild voice tremor. Neck is mildly rigid with decrease in passive ROM. Oropharynx exam reveals: mild mouth dryness, adequate dental hygiene and mild airway crowding, dentures in place. Mallampati is class II. Tongue protrudes centrally and palate elevates symmetrically. Tonsils are 1+ in size.   Chest: Clear to auscultation without wheezing, rhonchi or crackles noted.  Heart: S1+S2+0, regular and normal without murmurs, rubs or gallops noted.   Abdomen: Soft, non-tender and non-distended with normal bowel sounds appreciated on auscultation.  Extremities: There is trace pitting edema in the L ankle.   Skin: Warm and dry without trophic changes noted. There are no varicose veins.  Musculoskeletal: exam reveals no obvious joint deformities, tenderness or joint swelling or erythema.   Neurologically:  Mental status: The patient is awake, alert and oriented in all 4 spheres. His immediate and remote memory, attention, language skills and fund of  knowledge are appropriate. There is no evidence of aphasia, agnosia, apraxia or anomia. Speech is clear with normal prosody and enunciation. Thought process is linear. Mood is normal and affect is normal.  Cranial nerves II - XII are as described above under HEENT exam. In  addition: shoulder shrug is normal with equal shoulder height noted. Motor exam: Normal bulk, strength and tone is noted. There is no drift, rebound. He has a mild to moderate resting tremor in the right more than left upper extremity, a postural tremor in both upper extremities which is mild to moderate and action tremor which is mild in both upper extremities, no other tremor, no lower extremity tremor. Romberg is not testable safely. Reflexes are 1+ throughout. Fine motor skills and coordination: Mild to moderately impaired in the upper and lower extremities with finger taps and foot taps and rapid alternating patting. He has otherwise no evidence of cerebellar dysfunction with dysmetria or intention tremor.  Sensory exam is stable and intact to light touch throughout.  Gait, station and balance: He stands with difficulty.  posture is mildly stooped, possibly advanced for age, he has difficulty with walking but is able to walk with his 4 wheeled walker, he has some stutter steps in the beginning and then is able to walk a little better. He turns in 4 steps, balance is impaired.   Assessment and Plan:   In summary, Ramar Nobrega is a very pleasant 81 y.o.-year old male With an underlying medical history of hypertension, hyperlipidemia, BPH, diverticulitis and overweight state and has a long-standing history of tremors affecting both upper extremities, who returns for follow-up. He has been on long-acting Inderal for quite some time and also topiramate. We increased this last time to 100 mg twice daily, he seems to tolerate this. He does feel that his tremor is progressive. He has developed more difficulty with gait and balance. We did  physical therapy for this. He was in the past unable to tolerate Mysoline. I suggested we continue with the current regimen of topiramate 100 mg twice daily and long-acting propranolol 120 mg once daily. We talked about gait safety and fall was quite a bit today. He is advised to be really cautious with his driving as well. He is advised to limit his driving. His wife is advised to monitor his driving skills. He is furthermore advised to stay well-hydrated, change positions slowly and uses walker at all times for gait safety. I suggested a 6 month follow-up, he can see one of our nurse practitioners at the time and I will see him back after that. I answered all their questions today and the patient and his wife were in agreement, he did not need any refills today. Of note, he has been recently started on Zoloft 50 mg once daily by his primary care provider for depression.  I spent 25 minutes in total face-to-face time with the patient, more than 50% of which was spent in counseling and coordination of care, reviewing test results, reviewing medication and discussing or reviewing the diagnosis of ET and gait d/o, its prognosis and treatment options. Pertinent laboratory and imaging test results that were available during this visit with the patient were reviewed by me and considered in my medical decision making (see chart for details).

## 2016-11-23 NOTE — Patient Instructions (Addendum)
  Please remember, that any kind of tremor may be exacerbated by anxiety, anger, nervousness, excitement, dehydration, sleep deprivation, by caffeine, and low blood sugar values or blood sugar fluctuations. There are very few medications that symptomatically help with tremor reduction, none are without potential side effects.  I am worried about your driving, please have your family monitor it and I would suggest at this point only local roads, familiar routes, no nighttime and no highway driving. Please drive carefully.  Please use your walker at all times.  We will keep your medications the same. We can see you in 6 months, you can see one of our nurse practitioners and I will see you after that.

## 2016-12-06 DIAGNOSIS — E7800 Pure hypercholesterolemia, unspecified: Secondary | ICD-10-CM | POA: Diagnosis not present

## 2016-12-06 DIAGNOSIS — Z23 Encounter for immunization: Secondary | ICD-10-CM | POA: Diagnosis not present

## 2016-12-06 DIAGNOSIS — E538 Deficiency of other specified B group vitamins: Secondary | ICD-10-CM | POA: Diagnosis not present

## 2016-12-06 DIAGNOSIS — M25512 Pain in left shoulder: Secondary | ICD-10-CM | POA: Diagnosis not present

## 2016-12-06 DIAGNOSIS — L409 Psoriasis, unspecified: Secondary | ICD-10-CM | POA: Diagnosis not present

## 2016-12-06 DIAGNOSIS — K4090 Unilateral inguinal hernia, without obstruction or gangrene, not specified as recurrent: Secondary | ICD-10-CM | POA: Diagnosis not present

## 2016-12-09 DIAGNOSIS — B353 Tinea pedis: Secondary | ICD-10-CM | POA: Diagnosis not present

## 2016-12-09 DIAGNOSIS — B351 Tinea unguium: Secondary | ICD-10-CM | POA: Diagnosis not present

## 2016-12-09 DIAGNOSIS — L304 Erythema intertrigo: Secondary | ICD-10-CM | POA: Diagnosis not present

## 2016-12-09 DIAGNOSIS — L821 Other seborrheic keratosis: Secondary | ICD-10-CM | POA: Diagnosis not present

## 2017-01-13 DIAGNOSIS — B356 Tinea cruris: Secondary | ICD-10-CM | POA: Diagnosis not present

## 2017-01-31 DIAGNOSIS — R32 Unspecified urinary incontinence: Secondary | ICD-10-CM | POA: Diagnosis not present

## 2017-01-31 DIAGNOSIS — E538 Deficiency of other specified B group vitamins: Secondary | ICD-10-CM | POA: Diagnosis not present

## 2017-01-31 DIAGNOSIS — I10 Essential (primary) hypertension: Secondary | ICD-10-CM | POA: Diagnosis not present

## 2017-01-31 DIAGNOSIS — R600 Localized edema: Secondary | ICD-10-CM | POA: Diagnosis not present

## 2017-01-31 DIAGNOSIS — N189 Chronic kidney disease, unspecified: Secondary | ICD-10-CM | POA: Diagnosis not present

## 2017-02-14 DIAGNOSIS — L739 Follicular disorder, unspecified: Secondary | ICD-10-CM | POA: Diagnosis not present

## 2017-02-14 DIAGNOSIS — L578 Other skin changes due to chronic exposure to nonionizing radiation: Secondary | ICD-10-CM | POA: Diagnosis not present

## 2017-02-14 DIAGNOSIS — L570 Actinic keratosis: Secondary | ICD-10-CM | POA: Diagnosis not present

## 2017-02-14 DIAGNOSIS — B356 Tinea cruris: Secondary | ICD-10-CM | POA: Diagnosis not present

## 2017-02-21 ENCOUNTER — Emergency Department (HOSPITAL_COMMUNITY): Payer: Medicare Other

## 2017-02-21 ENCOUNTER — Encounter (HOSPITAL_COMMUNITY): Payer: Self-pay

## 2017-02-21 ENCOUNTER — Emergency Department (HOSPITAL_COMMUNITY)
Admission: EM | Admit: 2017-02-21 | Discharge: 2017-02-21 | Disposition: A | Discharge status: Home / Self Care | Payer: Medicare Other | Attending: Emergency Medicine | Admitting: Emergency Medicine

## 2017-02-21 DIAGNOSIS — R109 Unspecified abdominal pain: Secondary | ICD-10-CM | POA: Diagnosis not present

## 2017-02-21 DIAGNOSIS — R0789 Other chest pain: Secondary | ICD-10-CM | POA: Diagnosis not present

## 2017-02-21 DIAGNOSIS — I10 Essential (primary) hypertension: Secondary | ICD-10-CM | POA: Insufficient documentation

## 2017-02-21 DIAGNOSIS — R0781 Pleurodynia: Secondary | ICD-10-CM | POA: Diagnosis not present

## 2017-02-21 DIAGNOSIS — Z87891 Personal history of nicotine dependence: Secondary | ICD-10-CM | POA: Insufficient documentation

## 2017-02-21 DIAGNOSIS — Z7982 Long term (current) use of aspirin: Secondary | ICD-10-CM | POA: Insufficient documentation

## 2017-02-21 DIAGNOSIS — K5730 Diverticulosis of large intestine without perforation or abscess without bleeding: Secondary | ICD-10-CM | POA: Diagnosis not present

## 2017-02-21 LAB — URINALYSIS, ROUTINE W REFLEX MICROSCOPIC
Bacteria, UA: NONE SEEN
Bilirubin Urine: NEGATIVE
Glucose, UA: NEGATIVE mg/dL
Hgb urine dipstick: NEGATIVE
Ketones, ur: NEGATIVE mg/dL
NITRITE: NEGATIVE
PH: 6 (ref 5.0–8.0)
Protein, ur: NEGATIVE mg/dL
SPECIFIC GRAVITY, URINE: 1.016 (ref 1.005–1.030)
Squamous Epithelial / LPF: NONE SEEN

## 2017-02-21 LAB — LIPASE, BLOOD: LIPASE: 68 U/L — AB (ref 11–51)

## 2017-02-21 LAB — COMPREHENSIVE METABOLIC PANEL
ALBUMIN: 3.6 g/dL (ref 3.5–5.0)
ALK PHOS: 112 U/L (ref 38–126)
ALT: 41 U/L (ref 17–63)
AST: 65 U/L — AB (ref 15–41)
Anion gap: 11 (ref 5–15)
BILIRUBIN TOTAL: 0.5 mg/dL (ref 0.3–1.2)
BUN: 17 mg/dL (ref 6–20)
CALCIUM: 9.1 mg/dL (ref 8.9–10.3)
CO2: 23 mmol/L (ref 22–32)
CREATININE: 1.25 mg/dL — AB (ref 0.61–1.24)
Chloride: 108 mmol/L (ref 101–111)
GFR calc Af Amer: >60 mL/min (ref >60)
GFR, EST NON AFRICAN AMERICAN: 53 mL/min — AB (ref >60)
GLUCOSE: 120 mg/dL — AB (ref 65–99)
POTASSIUM: 3.2 mmol/L — AB (ref 3.5–5.1)
Sodium: 142 mmol/L (ref 135–145)
TOTAL PROTEIN: 7.2 g/dL (ref 6.5–8.1)

## 2017-02-21 LAB — CBC WITH DIFFERENTIAL/PLATELET
BASOS ABS: 0 10*3/uL (ref 0.0–0.1)
BASOS PCT: 0 %
Eosinophils Absolute: 0.1 10*3/uL (ref 0.0–0.7)
Eosinophils Relative: 1 %
HEMATOCRIT: 38.1 % — AB (ref 39.0–52.0)
HEMOGLOBIN: 12.6 g/dL — AB (ref 13.0–17.0)
LYMPHS PCT: 16 %
Lymphs Abs: 2.3 10*3/uL (ref 0.7–4.0)
MCH: 30.7 pg (ref 26.0–34.0)
MCHC: 33.1 g/dL (ref 30.0–36.0)
MCV: 92.9 fL (ref 78.0–100.0)
MONO ABS: 0.6 10*3/uL (ref 0.1–1.0)
Monocytes Relative: 4 %
NEUTROS ABS: 11.1 10*3/uL — AB (ref 1.7–7.7)
NEUTROS PCT: 79 %
Platelets: 289 10*3/uL (ref 150–400)
RBC: 4.1 MIL/uL — AB (ref 4.22–5.81)
RDW: 15.3 % (ref 11.5–15.5)
WBC: 14.1 10*3/uL — ABNORMAL HIGH (ref 4.0–10.5)

## 2017-02-21 LAB — TROPONIN I: Troponin I: <0.03 ng/mL (ref <0.03)

## 2017-02-21 LAB — D-DIMER, QUANTITATIVE: D-Dimer, Quant: 0.91 ug/mL-FEU — ABNORMAL HIGH (ref 0.00–0.50)

## 2017-02-21 MED ORDER — IOPAMIDOL (ISOVUE-370) INJECTION 76%
100.0000 mL | Freq: Once | INTRAVENOUS | Status: AC | PRN
  Administered 2017-02-21: 100 mL via INTRAVENOUS

## 2017-02-21 MED ORDER — KETOROLAC TROMETHAMINE 30 MG/ML IJ SOLN
15.0000 mg | Freq: Once | INTRAMUSCULAR | Status: AC
  Administered 2017-02-21: 15 mg via INTRAVENOUS
  Filled 2017-02-21: qty 1

## 2017-02-21 MED ORDER — IOPAMIDOL (ISOVUE-370) INJECTION 76%
INTRAVENOUS | Status: AC
  Administered 2017-02-21: 100 mL via INTRAVENOUS
  Filled 2017-02-21: qty 100

## 2017-02-21 NOTE — ED Triage Notes (Signed)
Right rib/axilla pain states had pain about a week ago and went away states worse tonight.

## 2017-02-21 NOTE — Discharge Instructions (Signed)
There is no evidence of heart attack or blood clot in the lung. Follow up with your doctor. Return to the ED if you develop new or worsening symptoms.  °

## 2017-02-21 NOTE — ED Notes (Signed)
Patient's wife left to obtain car prior to discharging and did not sign the discharge.

## 2017-02-21 NOTE — ED Provider Notes (Signed)
[facility] DEPT Provider Note   CSN: [id number] Arrival date & time: 02/21/17  5643  By signing my name below, I, Oleh Genin, attest that this documentation has been prepared under the direction and in the presence of Ezequiel Essex, MD. Electronically Signed: Oleh Genin, Scribe. 02/21/17. 2:46 AM.   History   Chief Complaint Chief Complaint  Patient presents with  . Chest Pain    HPI Andres Lopez is a 81 y.o. male with history of hypertension and hyperlipidemia who presents to the ED for evaluation of rib pain. This patient states that several hours ago he was watching television when he had onset of pain over the R lower ribs versus RUQ abdominal pain with nausea. No vomiting. Not worse when breathing. His symptoms onset shortly after a meal. He has had a cough for several days with clear sputum production. No fever. He denies any anterior chest pain. He does state that he had similar symptoms 4 weeks ago that resolved spontaneously without physician evaluation. No history of ulcers.   The history is provided by the patient. No language interpreter was used.    Past Medical History:  Diagnosis Date  . Essential tremor 01/15/2014  . Hypertension     Patient Active Problem List   Diagnosis Date Noted  . Iron deficiency anemia 01/26/2016  . Essential tremor 01/15/2014  . Leukocytosis 12/10/2013  . Dehydration 09/20/2013  . Hypokalemia 09/20/2013  . Anemia 09/20/2013  . HTN (hypertension) 09/20/2013    Past Surgical History:  Procedure Laterality Date  . TUMOR REMOVAL     neck       Home Medications    Prior to Admission medications   Medication Sig Start Date End Date Taking? Authorizing Provider  ascorbic acid (VITAMIN C) 500 MG tablet Take 500 mg by mouth daily.    Historical Provider, MD  aspirin EC 81 MG tablet Take 81 mg by mouth daily at 6 PM.    Historical Provider, MD  atorvastatin (LIPITOR) 80 MG tablet Take 40 mg by mouth every other  day.     Historical Provider, MD  ferrous sulfate 325 (65 FE) MG tablet Take 325 mg by mouth every other day. One tablet by mouth every other day 01/27/16    Historical Provider, MD  furosemide (LASIX) 40 MG tablet Take 40 mg by mouth. Takes prn    Historical Provider, MD  losartan-hydrochlorothiazide (HYZAAR) 100-25 MG per tablet Take 1 tablet by mouth daily. 1/2 tablet daily    Historical Provider, MD  propranolol ER (INDERAL LA) 120 MG 24 hr capsule Take 1 capsule (120 mg total) by mouth daily. 05/19/16   Star Age, MD  topiramate (TOPAMAX) 50 MG tablet Take 2 tablets (100 mg total) by mouth 2 (two) times daily. 05/19/16   Star Age, MD    Family History Family History  Problem Relation Age of Onset  . Parkinsonism Mother     Social History Social History  Substance Use Topics  . Smoking status: Former Research scientist (life sciences)  . Smokeless tobacco: Never Used  . Alcohol use No     Allergies   Tramadol   Review of Systems Review of Systems  Respiratory: Positive for cough.   Cardiovascular: Negative for chest pain.  Gastrointestinal: Positive for abdominal pain and nausea. Negative for diarrhea and vomiting.  All other systems reviewed and are negative.    Physical Exam Updated Vital Signs BP 132/64 (BP Location: Right Arm)   Pulse 78   Temp 98 F (36.7 C) (  Oral)   Resp 18   Wt 164 lb (74.4 kg)   SpO2 98%   BMI 27.29 kg/m   Physical Exam  Constitutional: He is oriented to person, place, and time. He appears well-developed and well-nourished. No distress.  HENT:  Head: Normocephalic and atraumatic.  Mouth/Throat: Oropharynx is clear and moist. No oropharyngeal exudate.  Eyes: Conjunctivae and EOM are normal. Pupils are equal, round, and reactive to light.  Neck: Normal range of motion. Neck supple.  No meningismus.  Cardiovascular: Normal rate, regular rhythm, normal heart sounds and intact distal pulses.   No murmur heard. Pulmonary/Chest: Effort normal and breath sounds  normal. No respiratory distress.  Reporting R lower rib pain that is not reproducible.  Abdominal: Soft. There is no rebound and no guarding.  No abdominal tenderness.  Musculoskeletal: Normal range of motion. He exhibits no edema or tenderness.  Neurological: He is alert and oriented to person, place, and time. No cranial nerve deficit. He exhibits normal muscle tone. Coordination normal.   5/5 strength throughout. CN 2-12 intact.Equal grip strength.  Resting tremor of hands  Skin: Skin is warm.  Psychiatric: He has a normal mood and affect. His behavior is normal.  Nursing note and vitals reviewed.    ED Treatments / Results  Labs (all labs ordered are listed, but only abnormal results are displayed) Labs Reviewed  CBC WITH DIFFERENTIAL/PLATELET - Abnormal; Notable for the following:       Result Value   WBC 14.1 (*)    RBC 4.10 (*)    Hemoglobin 12.6 (*)    HCT 38.1 (*)    Neutro Abs 11.1 (*)    All other components within normal limits  COMPREHENSIVE METABOLIC PANEL - Abnormal; Notable for the following:    Potassium 3.2 (*)    Glucose, Bld 120 (*)    Creatinine, Ser 1.25 (*)    AST 65 (*)    GFR calc non Af Amer 53 (*)    All other components within normal limits  LIPASE, BLOOD - Abnormal; Notable for the following:    Lipase 68 (*)    All other components within normal limits  URINALYSIS, ROUTINE W REFLEX MICROSCOPIC - Abnormal; Notable for the following:    Leukocytes, UA MODERATE (*)    All other components within normal limits  D-DIMER, QUANTITATIVE (NOT AT [facility]) - Abnormal; Notable for the following:    D-Dimer, Quant 0.91 (*)    All other components within normal limits  TROPONIN I  TROPONIN I    EKG  EKG Interpretation  Date/Time:  Monday February 21 2017 03:09:49 EDT Ventricular Rate:  63 PR Interval:    QRS Duration: 103 QT Interval:  450 QTC Calculation: 461 R Axis:   85 Text Interpretation:  Normal sinus rhythm Borderline right axis deviation  Borderline T abnormalities, inferior leads Artifact No significant change was found Confirmed by Wyvonnia Dusky  MD, Naylah Cork [phone]) on 02/21/2017 3:43:38 AM Also confirmed by Wyvonnia Dusky  MD, Elexius Minar [phone]), editor Drema Pry [phone])  on 02/21/2017 7:04:06 AM       Radiology Dg Ribs Unilateral W/chest Right  Result Date: 02/21/2017 CLINICAL DATA:  Pleuritic right chest wall pain 1 week ago, spontaneously resolved. Worse tonight. EXAM: RIGHT RIBS AND CHEST - 3+ VIEW COMPARISON:  09/20/2013 FINDINGS: No fracture or other bone lesions are seen involving the ribs. There is no evidence of pneumothorax or pleural effusion. Both lungs are clear. Heart size and mediastinal contours are within normal limits. IMPRESSION: Negative.  Electronically Signed   By: Andreas Newport M.D.   On: 02/21/2017 03:19   Ct Angio Chest Pe W And/or Wo Contrast  Result Date: 02/21/2017 CLINICAL DATA:  Right lower chest and right upper abdomen pain for several hours. Nausea and productive cough. EXAM: CT ANGIOGRAPHY CHEST CT ABDOMEN AND PELVIS WITH CONTRAST TECHNIQUE: Multidetector CT imaging of the chest was performed using the standard protocol during bolus administration of intravenous contrast. Multiplanar CT image reconstructions and MIPs were obtained to evaluate the vascular anatomy. Multidetector CT imaging of the abdomen and pelvis was performed using the standard protocol during bolus administration of intravenous contrast. CONTRAST:  100 mL Isovue 370 intravenous COMPARISON:  None. FINDINGS: CTA CHEST FINDINGS Cardiovascular: Satisfactory opacification of the pulmonary arteries to the segmental level. No evidence of pulmonary embolism. Normal heart size. No pericardial effusion. Extensive atherosclerotic calcifications of the coronary arteries. Moderate atherosclerotic calcification of the thoracic aorta. Incidental aberrant right subclavian artery, a normal variant. Mediastinum/Nodes: No enlarged mediastinal, hilar, or  axillary lymph nodes. Thyroid gland, trachea, and esophagus demonstrate no significant findings. Lungs/Pleura: Mild linear atelectatic appearing opacity in the right middle lobe base. No confluent airspace consolidation. Airways are patent. No pleural effusions. Incidental calcified granuloma of the right lower lobe superior segment. Musculoskeletal: No chest wall abnormality. No acute or significant osseous findings. Review of the MIP images confirms the above findings. CT ABDOMEN and PELVIS FINDINGS Hepatobiliary: No focal liver abnormality is seen. No gallstones, gallbladder wall thickening, or biliary dilatation. Pancreas: Unremarkable. No pancreatic ductal dilatation or surrounding inflammatory changes. Spleen: Normal in size without focal abnormality. Adrenals/Urinary Tract: There are several low-attenuation lesions of the kidneys, most likely benign cysts. No suspicious renal parenchymal lesion. Collecting systems and ureters are unremarkable. Urinary bladder is unremarkable except for a moderate impression on its base from the markedly enlarged prostate. Stomach/Bowel: Extensive colonic diverticulosis. Stomach and small bowel are unremarkable. No bowel obstruction. No focal inflammation of bowel. Appendix is normal. Vascular/Lymphatic: The abdominal aorta is normal in caliber with moderate atherosclerotic calcification. No pathologic adenopathy in the abdomen or pelvis. Reproductive: Marked prostatic enlargement. Other: Mild hazy attenuation of the mesentery, particularly in the pericolic gutters and this is mildly more prominent on the right. This is more diffuse than would typically be seen with epiploic appendagitis. A degree of mesenteritis may be consideration. No focal inflammation. No ascites. No extraluminal gas. Small fat containing inguinal hernias bilaterally. Musculoskeletal: No acute or significant osseous findings. Review of the MIP images confirms the above findings. IMPRESSION: 1. Negative  for acute pulmonary embolism. 2. Extensive atherosclerosis of the aorta and coronary arteries. 3. Colonic diverticulosis without evidence of diverticulitis. 4. Mild hazy attenuation in the mesentery without focal lesion. This is slightly more prominent in the right pericolic gutter. A component of mesenteritis might be present. Epiploic appendagitis is less likely. Appendix is normal. No bowel obstruction or focal inflammation of bowel. Electronically Signed   By: Andreas Newport M.D.   On: 02/21/2017 05:29   Ct Abdomen Pelvis W Contrast  Result Date: 02/21/2017 CLINICAL DATA:  Right lower chest and right upper abdomen pain for several hours. Nausea and productive cough. EXAM: CT ANGIOGRAPHY CHEST CT ABDOMEN AND PELVIS WITH CONTRAST TECHNIQUE: Multidetector CT imaging of the chest was performed using the standard protocol during bolus administration of intravenous contrast. Multiplanar CT image reconstructions and MIPs were obtained to evaluate the vascular anatomy. Multidetector CT imaging of the abdomen and pelvis was performed using the standard protocol during bolus administration  of intravenous contrast. CONTRAST:  100 mL Isovue 370 intravenous COMPARISON:  None. FINDINGS: CTA CHEST FINDINGS Cardiovascular: Satisfactory opacification of the pulmonary arteries to the segmental level. No evidence of pulmonary embolism. Normal heart size. No pericardial effusion. Extensive atherosclerotic calcifications of the coronary arteries. Moderate atherosclerotic calcification of the thoracic aorta. Incidental aberrant right subclavian artery, a normal variant. Mediastinum/Nodes: No enlarged mediastinal, hilar, or axillary lymph nodes. Thyroid gland, trachea, and esophagus demonstrate no significant findings. Lungs/Pleura: Mild linear atelectatic appearing opacity in the right middle lobe base. No confluent airspace consolidation. Airways are patent. No pleural effusions. Incidental calcified granuloma of the right  lower lobe superior segment. Musculoskeletal: No chest wall abnormality. No acute or significant osseous findings. Review of the MIP images confirms the above findings. CT ABDOMEN and PELVIS FINDINGS Hepatobiliary: No focal liver abnormality is seen. No gallstones, gallbladder wall thickening, or biliary dilatation. Pancreas: Unremarkable. No pancreatic ductal dilatation or surrounding inflammatory changes. Spleen: Normal in size without focal abnormality. Adrenals/Urinary Tract: There are several low-attenuation lesions of the kidneys, most likely benign cysts. No suspicious renal parenchymal lesion. Collecting systems and ureters are unremarkable. Urinary bladder is unremarkable except for a moderate impression on its base from the markedly enlarged prostate. Stomach/Bowel: Extensive colonic diverticulosis. Stomach and small bowel are unremarkable. No bowel obstruction. No focal inflammation of bowel. Appendix is normal. Vascular/Lymphatic: The abdominal aorta is normal in caliber with moderate atherosclerotic calcification. No pathologic adenopathy in the abdomen or pelvis. Reproductive: Marked prostatic enlargement. Other: Mild hazy attenuation of the mesentery, particularly in the pericolic gutters and this is mildly more prominent on the right. This is more diffuse than would typically be seen with epiploic appendagitis. A degree of mesenteritis may be consideration. No focal inflammation. No ascites. No extraluminal gas. Small fat containing inguinal hernias bilaterally. Musculoskeletal: No acute or significant osseous findings. Review of the MIP images confirms the above findings. IMPRESSION: 1. Negative for acute pulmonary embolism. 2. Extensive atherosclerosis of the aorta and coronary arteries. 3. Colonic diverticulosis without evidence of diverticulitis. 4. Mild hazy attenuation in the mesentery without focal lesion. This is slightly more prominent in the right pericolic gutter. A component of  mesenteritis might be present. Epiploic appendagitis is less likely. Appendix is normal. No bowel obstruction or focal inflammation of bowel. Electronically Signed   By: Andreas Newport M.D.   On: 02/21/2017 05:29    Procedures Procedures (including critical care time)  Medications Ordered in ED Medications - No data to display   Initial Impression / Assessment and Plan / ED Course  I have reviewed the triage vital signs and the nursing notes.  Pertinent labs & imaging results that were available during my care of the patient were reviewed by me and considered in my medical decision making (see chart for details).     R rib pain that is not reproducible.  Some cough. No SOB no fever. Not exertional or pleuritic.  EKG nsr. CXR negative for rib fracture or PTX.  Labs reassuring. D-dimer elevated.  LFTs and lipase normal.   CTPE negative.  No RUQ tenderness on exam. Doubt gallbladder pathology.  Rib pain resolved after toradol in the ED. No further symptoms. Doubt ACS, troponin negative x2. No PE, PTX, or PNA.  Possible rib injury due to coughing. Followup with PCP, return precautions discussed.  Final Clinical Impressions(s) / ED Diagnoses   Final diagnoses:  Rib pain on right side    New Prescriptions New Prescriptions   No medications on file  I personally performed the services described in this documentation, which was scribed in my presence. The recorded information has been reviewed and is accurate.   Ezequiel Essex, MD 02/21/17 [phone]

## 2017-05-02 DIAGNOSIS — G250 Essential tremor: Secondary | ICD-10-CM | POA: Diagnosis not present

## 2017-05-02 DIAGNOSIS — I10 Essential (primary) hypertension: Secondary | ICD-10-CM | POA: Diagnosis not present

## 2017-05-02 DIAGNOSIS — R600 Localized edema: Secondary | ICD-10-CM | POA: Diagnosis not present

## 2017-05-02 DIAGNOSIS — R1011 Right upper quadrant pain: Secondary | ICD-10-CM | POA: Diagnosis not present

## 2017-05-23 DIAGNOSIS — R1011 Right upper quadrant pain: Secondary | ICD-10-CM | POA: Diagnosis not present

## 2017-05-23 DIAGNOSIS — F329 Major depressive disorder, single episode, unspecified: Secondary | ICD-10-CM | POA: Diagnosis not present

## 2017-05-25 ENCOUNTER — Ambulatory Visit (INDEPENDENT_AMBULATORY_CARE_PROVIDER_SITE_OTHER): Payer: Medicare Other | Admitting: Neurology

## 2017-05-25 ENCOUNTER — Encounter: Payer: Self-pay | Admitting: Neurology

## 2017-05-25 VITALS — BP 130/63 | HR 54 | Ht 65.0 in | Wt 169.0 lb

## 2017-05-25 DIAGNOSIS — M7989 Other specified soft tissue disorders: Secondary | ICD-10-CM | POA: Diagnosis not present

## 2017-05-25 DIAGNOSIS — R269 Unspecified abnormalities of gait and mobility: Secondary | ICD-10-CM | POA: Diagnosis not present

## 2017-05-25 DIAGNOSIS — R251 Tremor, unspecified: Secondary | ICD-10-CM | POA: Diagnosis not present

## 2017-05-25 DIAGNOSIS — G250 Essential tremor: Secondary | ICD-10-CM

## 2017-05-25 MED ORDER — PROPRANOLOL HCL ER 120 MG PO CP24: 120.0000 mg | ORAL_CAPSULE | Freq: Every day | ORAL | 3 refills | Status: DC

## 2017-05-25 MED ORDER — TOPIRAMATE 50 MG PO TABS: 100.0000 mg | ORAL_TABLET | Freq: Two times a day (BID) | ORAL | 3 refills | Status: DC
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
## 2017-05-25 NOTE — Patient Instructions (Addendum)
  Please remember, that any kind of tremor may be exacerbated by anxiety, anger, nervousness, excitement, dehydration, sleep deprivation, by caffeine, and low blood sugar values or blood sugar fluctuations. Some medications, especially some antidepressants and lithium can cause or exacerbate tremors. Tremors may temporarily calm down or subside with the use of a benzodiazepine such as Valium or related medications and with alcohol. Be aware, however, that drinking alcohol is not an approved or appropriate treatment for tremor control and long-term use of benzodiazepines such as Valium, lorazepam, alprazolam, or clonazepam can cause habit formation, physical and psychological addiction. There are very few medications that symptomatically help with tremor reduction, none are without potential side effects.    We will continue with your medications for tremor at the same doses.  We can see you in 6 months, you can see one of our nurse practitioners as you are stable. I will see you after that.   Please consider using compression socks for your swelling in the legs and follow up with Dr. Shelia Media. Compression socks are available at department stores or even your pharmacy.

## 2017-07-13 DIAGNOSIS — Z23 Encounter for immunization: Secondary | ICD-10-CM | POA: Diagnosis not present

## 2017-07-24 NOTE — Progress Notes (Signed)
[facility] PROGRESS NOTE Date of Visit: 07/25/2017   Andres Lopez, [place] [address]  DIAGNOSIS: Iron deficiency anemia, unspecified iron deficiency anemia type  CURRENT TREATMENT: Ferrous sulfate 325 mg once every other daily  INTERVAL HISTORY:  Andres Lopez 81 y.o. male who was initially referred by Dr. Shelia Media for management and evaluation of his chronic anemia on 12/10/2013. Today, he presents to my clinic with his wife. He was last seen by me one year ago. Overall things have been going well for him in the interim. He feels about the same as I last saw him. He has had no new admissions. He has been taking his iron supplement as prescribed without complication. He has been having tremors which has mildly worsened since I last saw him, but overall these have remained stable. His neurologist has placed him on Topomax for this which he states has been able to control this. He also notes that has had ongoing lower extremity swelling which is being worked up by Dr Shelia Media.   MEDICAL HISTORY: Past Medical History:  Diagnosis Date  . Essential tremor 01/15/2014  . Hypertension     INTERIM HISTORY: has Dehydration; Hypokalemia; Anemia; HTN (hypertension); Leukocytosis; Essential tremor; and Iron deficiency anemia on his problem list.    ALLERGIES:  is allergic to tramadol.  MEDICATIONS:  Allergies as of 07/25/2017      Reactions   Tramadol Other (See Comments)   "makes him crazy"      Medication List       Accurate as of 07/25/17  3:52 PM. Always use your most recent med list.          ascorbic acid 500 MG tablet Commonly known as:  VITAMIN C Take 500 mg by mouth daily.   aspirin EC 81 MG tablet Take 81 mg by mouth at bedtime.   atorvastatin 80 MG tablet Commonly known as:  LIPITOR Take 40 mg by mouth every other day.   ferrous sulfate 325 (65 FE) MG tablet Take 325 mg by mouth every other day.    losartan-hydrochlorothiazide 100-25 MG tablet Commonly known as:  HYZAAR Take 0.5 tablets by mouth daily. 1/2 tablet daily   pantoprazole 40 MG tablet Commonly known as:  PROTONIX Take 40 mg by mouth once.   propranolol ER 120 MG 24 hr capsule Commonly known as:  INDERAL LA Take 1 capsule (120 mg total) by mouth daily.   sertraline 50 MG tablet Commonly known as:  ZOLOFT Take 50 mg by mouth daily.   topiramate 50 MG tablet Commonly known as:  TOPAMAX Take 2 tablets (100 mg total) by mouth 2 (two) times daily.       SURGICAL HISTORY:  Past Surgical History:  Procedure Laterality Date  . TUMOR REMOVAL     neck    REVIEW OF SYSTEMS:   Constitutional: Denies fevers, chills or abnormal weight loss Eyes: Denies blurriness of vision Ears, nose, mouth, throat, and face: Denies mucositis or sore throat Respiratory: Denies cough, dyspnea or wheezes Cardiovascular: Denies palpitation, chest discomfort or lower extremity swelling Gastrointestinal:  Denies nausea, heartburn or change in bowel habits Skin: Denies abnormal skin rashes Lymphatics: Denies new lymphadenopathy or easy bruising Neurological:Denies numbness, tingling or new weaknesses Behavioral/Psych: Mood is stable, no new changes  All other systems were reviewed with the patient and are negative.  PHYSICAL EXAMINATION:  ECOG PERFORMANCE STATUS: 0  Blood pressure 139/81, pulse (!) 58, temperature 98.7 F (  37.1 C), temperature source Oral, resp. rate 20, height 5\' 5"  (1.651 m), weight 176 lb 12.8 oz (80.2 kg), SpO2 100 %.  GENERAL:alert, no distress and comfortable; tremors but otherwise gets to table without difficulty. Mildly overweight.  SKIN: skin color, texture, turgor are normal, no rashes or significant lesions EYES: normal, Conjunctiva are pink and non-injected, sclera clear OROPHARYNX:no exudate, no erythema and lips, buccal mucosa, and tongue normal  NECK: supple, thyroid normal size, non-tender, without  nodularity LYMPH:  no palpable lymphadenopathy in the cervical, axillary or supraclavicular LUNGS: clear to auscultation with normal breathing effort, no wheezes or rhonchi HEART: regular rate & rhythm and no murmurs. There is bilateral pitting edema up to knee.  ABDOMEN:abdomen soft, non-tender and normal bowel sounds Musculoskeletal:no cyanosis of digits and no clubbing  NEURO: alert & oriented x 3 with fluent speech, no focal motor/sensory deficits  Labs:  CBC Latest Ref Rng & Units 07/25/2017 02/21/2017 07/26/2016  WBC 4.0 - 10.3 10e3/uL 14.0(H) 14.1(H) 14.3(H)  Hemoglobin 13.0 - 17.1 g/dL 12.5(L) 12.6(L) 12.7(L)  Hematocrit 38.4 - 49.9 % 38.6 38.1(L) 38.6  Platelets 140 - 400 10e3/uL 255 289 261    CMP Latest Ref Rng & Units 02/21/2017 08/11/2015 02/10/2015  Glucose 65 - 99 mg/dL 120(H) 106 111  BUN 6 - 20 mg/dL 17 15.1 15.0  Creatinine 0.61 - 1.24 mg/dL 1.25(H) 1.2 1.1  Sodium 135 - 145 mmol/L 142 144 143  Potassium 3.5 - 5.1 mmol/L 3.2(L) 3.6 3.4(L)  Chloride 101 - 111 mmol/L 108 - -  CO2 22 - 32 mmol/L 23 20(L) 21(L)  Calcium 8.9 - 10.3 mg/dL 9.1 9.2 9.0  Total Protein 6.5 - 8.1 g/dL 7.2 7.0 6.9  Total Bilirubin 0.3 - 1.2 mg/dL 0.5 0.34 0.25  Alkaline Phos 38 - 126 U/L 112 110 111  AST 15 - 41 U/L 65(H) 13 10  ALT 17 - 63 U/L 41 13 12   Results for Andres Lopez, Andres Lopez (MRN [id number]) as of 01/26/2016 16:02  Ref. Range 02/10/2015 12:55 08/11/2015 13:00 01/26/2016 12:52  Iron Latest Ref Range: 42-163 ug/dL 89 66 62  UIBC Latest Ref Range: 117-376 ug/dL 200 205 217  TIBC Latest Ref Range: 202-409 ug/dL 289 271 279  %SAT Latest Ref Range: 20-55 % 31 24 22   Ferritin Latest Ref Range: 22-316 ng/ml 442 (H) 518 (H) 581 (H)    RADIOGRAPHIC STUDIES: No new scans    ASSESSMENT: Andres Lopez 81 y.o. male with a history of Iron deficiency anemia, unspecified iron deficiency anemia type   PLAN:   1. Chronic Anemia NOS, likely iron deficiency -His iron study in 2014 showed normal  ferritin at 210, low serum iron and saturation. -His anemia has been gradually improved since he started iron supplement. His hemoglobin today is very mildly low at 12.5. -His clinical presentation is more consistent with iron deficient anemia --GI work-up in 2014 was unremarkable making a GI bleeding source less likely. Still he does have a documented history of diverticulosis seen on colonoscopy done on 12/19/2006 due to hemoccult positivity.  -His iron study showed normal serum iron and transferrin saturation, his ferritin level was elevated at 581 in 01/2016, and l changed his ferrous sulfate 1 tab daily to every other day.  -His iron study results are still pending today, I'll call him tomorrow if this is abnormal.  -We'll continue ferrous sulfate.   2. Leukocytosis, chronic and mild.  -His white count is 14.0 (13.8 one year ago), with predominant neutrophils.  -This  has been chronic and stable, since 2008. Likely reactive. I do not think he needs further workup.   3. Tremors -He is currently followed by a neurologist for this who has placed him on Topomax. He reports that this does help with his tremors some. His neurologist will continue to monitor this for ongoing management.   4. Lower extremity edema -follow up with Dr Shelia Media. I encourage the patient to begin wearing compression stockings and to elevate his legs above the level of his heart when at rest. He will f/u w Dr Shelia Media for ongoing management of this.    PLAN: -I'll see him back in one year with lab -He will also follow-up with Dr. Shelia Media and his neurologist as scheduled  All questions were answered. The patient knows to call the clinic with any problems, questions or concerns. We can certainly see the patient much sooner if necessary.  I spent 15 minutes counseling the patient face to face. The total time spent in the appointment was 20 minutes.   Truitt Merle  07/25/2017 3:52 PM   This document serves as a record of services  personally performed by Truitt Merle, MD. It was created on her behalf by Reola Mosher, a trained medical scribe. The creation of this record is based on the scribe's personal observations and the provider's statements to them. This document has been checked and approved by the attending provider.  Addendum -His iron study today showed ferritin 573, normal serum iron and transferrin saturation, normal TIBC. I'll call patient and let him stop iron pill, and repeat lab in 3 months.   Truitt Merle  07/25/2017

## 2017-07-25 ENCOUNTER — Ambulatory Visit (HOSPITAL_BASED_OUTPATIENT_CLINIC_OR_DEPARTMENT_OTHER): Payer: Medicare Other | Admitting: Hematology

## 2017-07-25 ENCOUNTER — Encounter: Payer: Self-pay | Admitting: Hematology

## 2017-07-25 ENCOUNTER — Other Ambulatory Visit (HOSPITAL_BASED_OUTPATIENT_CLINIC_OR_DEPARTMENT_OTHER): Payer: Medicare Other

## 2017-07-25 ENCOUNTER — Telehealth: Payer: Self-pay | Admitting: Hematology

## 2017-07-25 VITALS — BP 139/81 | HR 58 | Temp 98.7°F | Resp 20 | Ht 65.0 in | Wt 176.8 lb

## 2017-07-25 DIAGNOSIS — R251 Tremor, unspecified: Secondary | ICD-10-CM | POA: Diagnosis not present

## 2017-07-25 DIAGNOSIS — D649 Anemia, unspecified: Secondary | ICD-10-CM

## 2017-07-25 DIAGNOSIS — D509 Iron deficiency anemia, unspecified: Secondary | ICD-10-CM

## 2017-07-25 DIAGNOSIS — R600 Localized edema: Secondary | ICD-10-CM | POA: Diagnosis not present

## 2017-07-25 DIAGNOSIS — D72829 Elevated white blood cell count, unspecified: Secondary | ICD-10-CM

## 2017-07-25 LAB — CBC & DIFF AND RETIC
BASO%: 0.3 % (ref 0.0–2.0)
Basophils Absolute: 0 10*3/uL (ref 0.0–0.1)
EOS ABS: 0.2 10*3/uL (ref 0.0–0.5)
EOS%: 1.7 % (ref 0.0–7.0)
HEMATOCRIT: 38.6 % (ref 38.4–49.9)
HGB: 12.5 g/dL — ABNORMAL LOW (ref 13.0–17.1)
IMMATURE RETIC FRACT: 9.6 % (ref 3.00–10.60)
LYMPH%: 25.2 % (ref 14.0–49.0)
MCH: 31 pg (ref 27.2–33.4)
MCHC: 32.4 g/dL (ref 32.0–36.0)
MCV: 95.8 fL (ref 79.3–98.0)
MONO#: 1.1 10*3/uL — ABNORMAL HIGH (ref 0.1–0.9)
MONO%: 7.5 % (ref 0.0–14.0)
NEUT%: 65.3 % (ref 39.0–75.0)
NEUTROS ABS: 9.1 10*3/uL — AB (ref 1.5–6.5)
PLATELETS: 255 10*3/uL (ref 140–400)
RBC: 4.03 10*6/uL — ABNORMAL LOW (ref 4.20–5.82)
RDW: 15.6 % — ABNORMAL HIGH (ref 11.0–14.6)
Retic %: 1.59 % (ref 0.80–1.80)
Retic Ct Abs: 64.08 10*3/uL (ref 34.80–93.90)
WBC: 14 10*3/uL — AB (ref 4.0–10.3)
lymph#: 3.5 10*3/uL — ABNORMAL HIGH (ref 0.9–3.3)

## 2017-07-25 LAB — FERRITIN: Ferritin: 573 ng/ml — ABNORMAL HIGH (ref 22–316)

## 2017-07-25 LAB — IRON AND TIBC
%SAT: 25 % (ref 20–55)
Iron: 64 ug/dL (ref 42–163)
TIBC: 254 ug/dL (ref 202–409)
UIBC: 190 ug/dL (ref 117–376)

## 2017-07-25 NOTE — Telephone Encounter (Signed)
Gave avs and calendar for October 2019 °

## 2017-07-29 ENCOUNTER — Other Ambulatory Visit: Payer: Self-pay | Admitting: Hematology

## 2017-08-01 ENCOUNTER — Telehealth: Payer: Self-pay | Admitting: *Deleted

## 2017-08-01 NOTE — Telephone Encounter (Signed)
Spoke with pt and informed pt of Dr. Ernestina Penna instructions below.  Gave pt appt for lab rechecked in Jan 7 , 2019 at 11 am.  Reinforced that pt needs to stop oral iron starting  08/02/17 until further instructions by md.  Pt voiced understanding.

## 2017-08-01 NOTE — Telephone Encounter (Signed)
-----   Message from Truitt Merle, MD sent at 07/29/2017  7:48 PM EDT ----- Please let pt know that his iron study results, ferritin high, I suggest him to stop oral iron and repeat iron study in 3 months, please let pt know and set up lab appointment  Thanks  Krista Blue

## 2017-08-03 ENCOUNTER — Telehealth: Payer: Self-pay | Admitting: Hematology

## 2017-08-03 NOTE — Telephone Encounter (Signed)
Scheduled appt per 10/8 sch message - patient is aware of appt date

## 2017-08-16 DIAGNOSIS — F339 Major depressive disorder, recurrent, unspecified: Secondary | ICD-10-CM | POA: Diagnosis not present

## 2017-08-16 DIAGNOSIS — R600 Localized edema: Secondary | ICD-10-CM | POA: Diagnosis not present

## 2017-10-26 DIAGNOSIS — N401 Enlarged prostate with lower urinary tract symptoms: Secondary | ICD-10-CM | POA: Diagnosis not present

## 2017-10-26 DIAGNOSIS — D509 Iron deficiency anemia, unspecified: Secondary | ICD-10-CM | POA: Diagnosis not present

## 2017-10-26 DIAGNOSIS — F339 Major depressive disorder, recurrent, unspecified: Secondary | ICD-10-CM | POA: Diagnosis not present

## 2017-10-26 DIAGNOSIS — I10 Essential (primary) hypertension: Secondary | ICD-10-CM | POA: Diagnosis not present

## 2017-10-26 DIAGNOSIS — N2581 Secondary hyperparathyroidism of renal origin: Secondary | ICD-10-CM | POA: Diagnosis not present

## 2017-10-26 DIAGNOSIS — I129 Hypertensive chronic kidney disease with stage 1 through stage 4 chronic kidney disease, or unspecified chronic kidney disease: Secondary | ICD-10-CM | POA: Diagnosis not present

## 2017-10-26 DIAGNOSIS — N189 Chronic kidney disease, unspecified: Secondary | ICD-10-CM | POA: Diagnosis not present

## 2017-10-31 ENCOUNTER — Inpatient Hospital Stay: Payer: Medicare Other | Attending: Hematology

## 2017-10-31 DIAGNOSIS — D649 Anemia, unspecified: Secondary | ICD-10-CM | POA: Insufficient documentation

## 2017-10-31 DIAGNOSIS — D509 Iron deficiency anemia, unspecified: Secondary | ICD-10-CM

## 2017-10-31 LAB — IRON AND TIBC
Iron: 21 ug/dL — ABNORMAL LOW (ref 42–163)
SATURATION RATIOS: 9 % — AB (ref 42–163)
TIBC: 251 ug/dL (ref 202–409)
UIBC: 230 ug/dL

## 2017-10-31 LAB — CBC
HEMATOCRIT: 36.7 % — AB (ref 38.4–49.9)
HEMOGLOBIN: 11.9 g/dL — AB (ref 13.0–17.1)
MCH: 30.4 pg (ref 27.2–33.4)
MCHC: 32.4 g/dL (ref 32.0–36.0)
MCV: 93.9 fL (ref 79.3–98.0)
PLATELETS: 303 10*3/uL (ref 140–400)
RBC: 3.91 MIL/uL — AB (ref 4.20–5.82)
RDW: 14.9 % (ref 11.0–15.6)
WBC: 16.4 10*3/uL — AB (ref 4.0–10.3)

## 2017-10-31 LAB — RETICULOCYTES
RBC.: 3.91 MIL/uL — ABNORMAL LOW (ref 4.20–5.82)
RETIC COUNT ABSOLUTE: 54.7 10*3/uL (ref 34.8–93.9)
Retic Ct Pct: 1.4 % (ref 0.8–1.8)

## 2017-10-31 LAB — FERRITIN: Ferritin: 716 ng/mL — ABNORMAL HIGH (ref 22–316)

## 2017-11-02 DIAGNOSIS — E778 Other disorders of glycoprotein metabolism: Secondary | ICD-10-CM | POA: Diagnosis not present

## 2017-11-02 DIAGNOSIS — R609 Edema, unspecified: Secondary | ICD-10-CM | POA: Diagnosis not present

## 2017-11-07 ENCOUNTER — Telehealth: Payer: Self-pay | Admitting: *Deleted

## 2017-11-07 NOTE — Telephone Encounter (Signed)
-----   Message from Truitt Merle, MD sent at 11/06/2017  6:39 PM EST ----- Please let pt know his lab result, iron level and saturation slightly low, ferritin high likely related to other chronic inflamation. I recommend him to restart iron pill 1 tab every other day. Thanks.   Truitt Merle  11/06/2017

## 2017-11-07 NOTE — Telephone Encounter (Signed)
TCT patient regarding recent labs. No answer but was able to leave VM message to return call to Dr. Ernestina Penna nurse.  Per Dr. Gordy Councilman iron level and saturation slightly low and she recommends restarting his daily iron tablet.  Elevated ferritin likely due to chronic inflammation.

## 2017-11-08 ENCOUNTER — Telehealth: Payer: Self-pay | Admitting: *Deleted

## 2017-11-08 DIAGNOSIS — E7800 Pure hypercholesterolemia, unspecified: Secondary | ICD-10-CM | POA: Diagnosis not present

## 2017-11-08 DIAGNOSIS — D509 Iron deficiency anemia, unspecified: Secondary | ICD-10-CM | POA: Diagnosis not present

## 2017-11-08 DIAGNOSIS — N2581 Secondary hyperparathyroidism of renal origin: Secondary | ICD-10-CM | POA: Diagnosis not present

## 2017-11-08 DIAGNOSIS — R251 Tremor, unspecified: Secondary | ICD-10-CM | POA: Diagnosis not present

## 2017-11-08 DIAGNOSIS — M1990 Unspecified osteoarthritis, unspecified site: Secondary | ICD-10-CM | POA: Diagnosis not present

## 2017-11-08 DIAGNOSIS — E669 Obesity, unspecified: Secondary | ICD-10-CM | POA: Diagnosis not present

## 2017-11-08 DIAGNOSIS — E538 Deficiency of other specified B group vitamins: Secondary | ICD-10-CM | POA: Diagnosis not present

## 2017-11-08 DIAGNOSIS — N183 Chronic kidney disease, stage 3 (moderate): Secondary | ICD-10-CM | POA: Diagnosis not present

## 2017-11-08 DIAGNOSIS — I129 Hypertensive chronic kidney disease with stage 1 through stage 4 chronic kidney disease, or unspecified chronic kidney disease: Secondary | ICD-10-CM | POA: Diagnosis not present

## 2017-11-08 NOTE — Telephone Encounter (Signed)
Received vm call from pt asking about lab work from last week.  [facility] also called asking  if there were any plans to give fereheme.  Return phone for Jeannie Done is [phone] x [phone]  Returned call to pt & left vm to call back for message from Dr Burr Medico about adding oral iron] Returned call to [facility] & no answer.  Message left for RN to call pt tomorrow.

## 2017-11-09 NOTE — Telephone Encounter (Signed)
Spoke with Dr. Burr Medico regarding possible need for IV iron for this gentleman. Dr. Burr Medico reviewed labs again and confirms that pt does well with oral iron supplement, therefore no need for IV fereheme.  Message left with [facility] [phone], ext [phone]) with above information.  TCT patient and advised him that Dr. Burr Medico does not think he needs IV fereheme at this time, just oral iron-1 tablet every other day. Pt relieved to know he does not need IV iron. He voiced understanding to take oral iron every other day.

## 2017-11-21 ENCOUNTER — Other Ambulatory Visit: Payer: Self-pay | Admitting: *Deleted

## 2017-11-22 ENCOUNTER — Other Ambulatory Visit: Payer: Self-pay | Admitting: *Deleted

## 2017-11-22 NOTE — Patient Outreach (Signed)
[place] [facility]) Care Management  11/21/16  Andres Lopez [birth year] [id number]   Telephone Screen  Referral Date: 11/11/17 Referral Source: MD referral (Dr. [name]) Referral Reason: Lift Chair and Raised Toilet Seat Insurance: Medicare, Generic Commercial   Outgoing telephone call to patient. HIPAA identifiers verified X's 2 with spouse Andres Lopez). Patient gave permission to speak with his spouse. He didn't want to talk for 15 to 20 minutes. Andres Lopez reported, patient is hard of hearing, slow with mobility, incontinent, and has tremors. Andres Lopez discussed how she has to assist patient with his ADL's. Spouse went into detail about having to pull patient out of the chair, having to change his depend, having to bathe and dress patient, and take him along to her MD appointments. Spouse stated, she is the main caregiver. Patient has a daughter, who doesn't come around, per Andres Lopez. RN CM discussed with Andres Lopez about the importance of caring for herself. Patient doesn't want strangers coming into the home. Andres Lopez believes a lift chair would be beneficial for the patient to assist with his mobility. MD made a referral to assist patient with obtaining a lift chair and a raised toilet seat. Andres Lopez verbalized having a portable toilet, which is more effective for the patient. [facility] services and benefits explained to Andres Lopez and she agreed to services. Andres Lopez reported, patient's Propranolol cost patient $168.00/mo.  Plan: RN CM advised patient to contact RNCM for any needs or concerns. RN CM will send [facility] SW referral for possible assistance with personal care services and DME. RN CM will send [facility] pharmacy referral for affording medication.  Lake Bells, RN, BSN, MHA/MSL, [place] Telephonic Care Manager Coordinator [facility] Direct Phone: [phone] Cell Phone: [phone] Toll Free: [phone] Fax: [phone]

## 2017-11-22 NOTE — Patient Outreach (Signed)
[facility]) Care Management  11/22/2017  Andres Lopez [birth year] [id number]   . CSW was able to make initial contact with patient's wife, Andres Lopez, today by phone.  Identity confirmed by wife for patient.   CSW introduced self, explained role and types of services provided through [facility] [place] [facility] ([place] [facility]).  CSW further explained to patient that CSW works with patient's RNCM, also with [facility] [place] [facility], . CSW then explained the reason for the call, indicating that patient's RNCM thought that patient would benefit from social work services and resources to assist with DME and in home care.  Wife pleasantly declines to talk today- stating, "todays not a good time to talk".  CSW offered to call back later in the week and she is agreeable to that plan.  CSW will call again later this week to assess needs further.  [name], MSW, [place] Worker [place] [phone]
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
## 2017-11-25 DIAGNOSIS — I129 Hypertensive chronic kidney disease with stage 1 through stage 4 chronic kidney disease, or unspecified chronic kidney disease: Secondary | ICD-10-CM | POA: Diagnosis not present

## 2017-11-28 ENCOUNTER — Encounter: Payer: Self-pay | Admitting: Nurse Practitioner

## 2017-11-28 ENCOUNTER — Ambulatory Visit (INDEPENDENT_AMBULATORY_CARE_PROVIDER_SITE_OTHER): Payer: Medicare Other | Admitting: Nurse Practitioner

## 2017-11-28 VITALS — BP 119/50 | HR 58 | Ht 65.0 in | Wt 173.8 lb

## 2017-11-28 DIAGNOSIS — R259 Unspecified abnormal involuntary movements: Secondary | ICD-10-CM | POA: Insufficient documentation

## 2017-11-28 DIAGNOSIS — G250 Essential tremor: Secondary | ICD-10-CM

## 2017-11-28 NOTE — Patient Instructions (Signed)
Continue long acting Inderal at current dose Continue Topamax 100mg  twice daily Continue using rolling walker for safe ambulation Follow up 8 months

## 2017-11-29 ENCOUNTER — Other Ambulatory Visit: Payer: Self-pay | Admitting: *Deleted

## 2017-12-01 ENCOUNTER — Other Ambulatory Visit: Payer: Self-pay | Admitting: Pharmacist

## 2017-12-01 NOTE — Patient Outreach (Signed)
[place] [facility]) Care Management  12/01/2017  Andres Lopez 1936/09/14 [id number]  Patient was referred to [place] by [facility] RN [place] for medication patient assistance evaluation.  Successful phone outreach to patient---HIPAA details verified and purpose of call explained to patient.  Patient reports his propranolol ER is about $168/90 day supply.  Patient reports he does not have prescription drug coverage through Medicare Part D.  He reports he does not have [employer] or retiree prescription benefits.    Discussed Programmer, applications Help---patient reports he may not meet requirements and doesn't wish to apply at this time.    Discussed RxOutreach---mail order pharmacy---appears patient may be able to obtain up to 30 capsules for $45.  Patient reports since it wouldn't provide much in cost savings, he doesn't wish to have information about RxOutreach.    Patient counseled on Medicare open enrollment which occurs each calendar year.   Patient denies other pharmacy needs and understands limited patient assistance options due to patient having no prescription insurance.   Plan:  Pharmacy episode closed.   Patient declines wanting to obtain propranolol ER from [facility].   Patient aware he can contact [facility] Pharmacist if new needs arise.   Karrie Meres, PharmD, [place] [phone]

## 2017-12-02 ENCOUNTER — Other Ambulatory Visit: Payer: Self-pay | Admitting: *Deleted

## 2017-12-05 ENCOUNTER — Other Ambulatory Visit: Payer: Self-pay | Admitting: *Deleted

## 2017-12-05 NOTE — Patient Outreach (Signed)
[place] [facility]) Care Management  12/05/2017  Andres Lopez 07-17-1936 [id number]    CSW spoke with patient and wife by phone again today for follow up regarding DME and personal care services. CSW advised them that Medicare will only cover; at best, a portion of the lift chair costs.  CSW also spoke with Gay Filler at [employer] office and briefed her on the patient/wife requests.  Have asked PCP to order [facility] PT, OT and bath aide to assist and assess/ complete home eval for needs including DME.  Per wife, "patient does not want anyone coming to the home", and I have advised them both a home assessment for [facility] and DME needs will be beneficial for obtaining specific needs, measurements, etc as well as to support medical necessity for insurance coverage determination, etc.   CSW will plan f/u call for update and further assessment of needs in 1- 2weeks.   [name], MSW, [place] Worker  [place] [phone]

## 2017-12-07 DIAGNOSIS — L309 Dermatitis, unspecified: Secondary | ICD-10-CM | POA: Diagnosis not present

## 2017-12-14 ENCOUNTER — Other Ambulatory Visit: Payer: Self-pay | Admitting: *Deleted

## 2017-12-14 ENCOUNTER — Telehealth: Payer: Self-pay | Admitting: Hematology

## 2017-12-14 DIAGNOSIS — G20 Parkinson's disease: Secondary | ICD-10-CM | POA: Diagnosis not present

## 2017-12-14 DIAGNOSIS — R0789 Other chest pain: Secondary | ICD-10-CM | POA: Diagnosis not present

## 2017-12-14 DIAGNOSIS — D72829 Elevated white blood cell count, unspecified: Secondary | ICD-10-CM | POA: Diagnosis not present

## 2017-12-14 DIAGNOSIS — N401 Enlarged prostate with lower urinary tract symptoms: Secondary | ICD-10-CM | POA: Diagnosis not present

## 2017-12-14 DIAGNOSIS — B351 Tinea unguium: Secondary | ICD-10-CM | POA: Diagnosis not present

## 2017-12-14 DIAGNOSIS — N2581 Secondary hyperparathyroidism of renal origin: Secondary | ICD-10-CM | POA: Diagnosis not present

## 2017-12-14 DIAGNOSIS — Z7982 Long term (current) use of aspirin: Secondary | ICD-10-CM | POA: Diagnosis not present

## 2017-12-14 DIAGNOSIS — I10 Essential (primary) hypertension: Secondary | ICD-10-CM | POA: Diagnosis not present

## 2017-12-14 DIAGNOSIS — E538 Deficiency of other specified B group vitamins: Secondary | ICD-10-CM | POA: Diagnosis not present

## 2017-12-14 DIAGNOSIS — Z6827 Body mass index (BMI) 27.0-27.9, adult: Secondary | ICD-10-CM | POA: Diagnosis not present

## 2017-12-14 DIAGNOSIS — D649 Anemia, unspecified: Secondary | ICD-10-CM | POA: Diagnosis not present

## 2017-12-14 DIAGNOSIS — N189 Chronic kidney disease, unspecified: Secondary | ICD-10-CM | POA: Diagnosis not present

## 2017-12-14 DIAGNOSIS — I129 Hypertensive chronic kidney disease with stage 1 through stage 4 chronic kidney disease, or unspecified chronic kidney disease: Secondary | ICD-10-CM | POA: Diagnosis not present

## 2017-12-14 DIAGNOSIS — D509 Iron deficiency anemia, unspecified: Secondary | ICD-10-CM | POA: Diagnosis not present

## 2017-12-14 DIAGNOSIS — E7800 Pure hypercholesterolemia, unspecified: Secondary | ICD-10-CM | POA: Diagnosis not present

## 2017-12-14 DIAGNOSIS — R32 Unspecified urinary incontinence: Secondary | ICD-10-CM | POA: Diagnosis not present

## 2017-12-14 DIAGNOSIS — F339 Major depressive disorder, recurrent, unspecified: Secondary | ICD-10-CM | POA: Diagnosis not present

## 2017-12-14 NOTE — Patient Outreach (Signed)
[place] [facility]) Care Management  12/14/2017  Andres Lopez 1936-06-13 [id number]    CSW spoke with wife by phone today who reports they are going to see Dr Shelia Media today.   She plans to inquire about lift chair and CSW has advised her that insurance will likely only cover a of portion of this.  CSW discussed possible other needs with wife; she denies any personal care support/services are needed as she takes care of him and "managing ok".  CSW offered to call back tomorrow for update after MD appointment.     [name], MSW, [place] Worker  [place] [phone]

## 2017-12-14 NOTE — Telephone Encounter (Signed)
Patient called wanting to schedule earlier appt he has low counts Dr said ok

## 2017-12-15 ENCOUNTER — Other Ambulatory Visit: Payer: Self-pay | Admitting: *Deleted

## 2017-12-15 ENCOUNTER — Telehealth: Payer: Self-pay | Admitting: Nurse Practitioner

## 2017-12-15 ENCOUNTER — Encounter: Payer: Self-pay | Admitting: *Deleted

## 2017-12-15 NOTE — Telephone Encounter (Signed)
Dr Audie Pinto, [facility] is referring physician for patient. Notes from dates requested successfully faxed as requested by [facility] in Dr Pennie Banter office.

## 2017-12-15 NOTE — Patient Outreach (Addendum)
[facility] [facility]) Care Management  12/15/2017  Andres Lopez [birth year] [id number]   [place] spoke with wife again today- she reports they did go to see Dr Shelia Media on yesterday and will be going to the [place] on Monday- "I don't understand why".  CSW has advised her that a lift chair will likely have an out of pocket expense and PCP office is working with her on ordering this. She remains adamant she can take care of patient and declines any personal care/ assistance support is needed. She is open to receiving resources for future reference or need.   Patient and wife decline further CSW needs. CSW will advise PCP and [facility] team of above and have encouraged re-consult if needs arise.   [name], MSW, [place] Worker  [place] [phone]

## 2017-12-15 NOTE — Patient Outreach (Signed)
Request received from Janet Caldwell, LCSW to mail patient personal care resources.  Information mailed today. 

## 2017-12-15 NOTE — Telephone Encounter (Signed)
Andres Lopez with [facility] is requesting office visit notes from 11/23/16, 05/25/17 and 11/28/17 faxed to [phone]. Andres Lopez stated if RN has any question she can be reached at [phone]

## 2017-12-18 NOTE — Progress Notes (Signed)
[facility] PROGRESS NOTE Date of Visit: 12/19/2017   [name], [place] [address]  DIAGNOSIS: Iron deficiency anemia, unspecified iron deficiency anemia type  Bandemia - Plan: BCR-ABL1 FISH  CURRENT TREATMENT: Ferrous sulfate 325 mg once every other daily.   INTERVAL HISTORY: Andres Lopez is here for follow up. He presents to the clinic today with his wife. He reports he is doing well overall. He notes nothing new recently except the addition of a K supplement. He states he had to take prednisone 1 week ago for a rash on his back. He is continuing to follow up with Dr. Shelia Media and his neurologist. He states he is taking an oral iron supplement every other day.   On review of systems, pt denies fever, or any other complaints at this time. Pertinent positives are listed and detailed within the above HPI.   MEDICAL HISTORY: Past Medical History:  Diagnosis Date  . Essential tremor 01/15/2014  . Hypertension     INTERIM HISTORY: has Dehydration; Hypokalemia; Anemia; HTN (hypertension); Leukocytosis; Essential tremor; Iron deficiency anemia; and Mixed action and resting tremor on their problem list.    ALLERGIES:  is allergic to tramadol.  MEDICATIONS:  Allergies as of 12/19/2017      Reactions   Tramadol Other (See Comments)   "makes him crazy"      Medication List        Accurate as of 12/19/17 11:50 PM. Always use your most recent med list.          ascorbic acid 500 MG tablet Commonly known as:  VITAMIN C Take 500 mg by mouth daily.   aspirin EC 81 MG tablet Take 81 mg by mouth at bedtime.   atorvastatin 80 MG tablet Commonly known as:  LIPITOR Take 40 mg by mouth every other day.   ferrous sulfate 325 (65 FE) MG tablet Take 325 mg by mouth every other day.   furosemide 40 MG tablet Commonly known as:  LASIX Take 40 mg by mouth 2 (two) times daily.   losartan 100 MG tablet Commonly  known as:  COZAAR Take 50 mg by mouth.   propranolol ER 120 MG 24 hr capsule Commonly known as:  INDERAL LA Take 1 capsule (120 mg total) by mouth daily.   sertraline 50 MG tablet Commonly known as:  ZOLOFT Take 50 mg by mouth daily.   TROKENDI XR 25 MG Cp24 Generic drug:  Topiramate ER Take 50 mg by mouth 2 (two) times daily.       SURGICAL HISTORY:  Past Surgical History:  Procedure Laterality Date  . TUMOR REMOVAL     neck    REVIEW OF SYSTEMS:   Constitutional: Denies fevers, chills or abnormal weight loss Eyes: Denies blurriness of vision Ears, nose, mouth, throat, and face: Denies mucositis or sore throat Respiratory: Denies cough, dyspnea or wheezes Cardiovascular: Denies palpitation, chest discomfort or lower extremity swelling Gastrointestinal:  Denies nausea, heartburn or change in bowel habits Skin: Denies abnormal skin rashes Lymphatics: Denies new lymphadenopathy or easy bruising Neurological:Denies numbness, tingling or new weaknesses Behavioral/Psych: Mood is stable, no new changes  All other systems were reviewed with the patient and are negative.  PHYSICAL EXAMINATION:  ECOG PERFORMANCE STATUS: 2  Blood pressure 102/70, pulse (!) 51, temperature 98.6 F (37 C), temperature source Oral, resp. rate 16, height '5\' 5"'  (1.651 m), weight 168 lb 12.8 oz (76.6 kg), SpO2 98 %.  GENERAL:alert,  no distress and comfortable; tremors but otherwise gets to table without difficulty. Mildly overweight.  SKIN: skin color, texture, turgor are normal, no rashes or significant lesions EYES: normal, Conjunctiva are pink and non-injected, sclera clear OROPHARYNX:no exudate, no erythema and lips, buccal mucosa, and tongue normal  NECK: supple, thyroid normal size, non-tender, without nodularity LYMPH:  no palpable lymphadenopathy in the cervical, axillary or supraclavicular LUNGS: clear to auscultation with normal breathing effort, no wheezes or rhonchi HEART: regular rate  & rhythm and no murmurs. There is bilateral pitting edema up to knee.  ABDOMEN:abdomen soft, non-tender and normal bowel sounds Musculoskeletal:no cyanosis of digits and no clubbing  NEURO: alert & oriented x 3 with fluent speech, no focal motor/sensory deficits  Labs:  CBC Latest Ref Rng & Units 12/19/2017 10/31/2017 07/25/2017  WBC 4.0 - 10.3 K/uL 18.2(H) 16.4(H) 14.0(H)  Hemoglobin 13.0 - 17.1 g/dL 12.4(L) 11.9(L) 12.5(L)  Hematocrit 38.4 - 49.9 % 38.6 36.7(L) 38.6  Platelets 140 - 400 K/uL 273 303 255    CMP Latest Ref Rng & Units 02/21/2017 08/11/2015 02/10/2015  Glucose 65 - 99 mg/dL 120(H) 106 111  BUN 6 - 20 mg/dL 17 15.1 15.0  Creatinine 0.61 - 1.24 mg/dL 1.25(H) 1.2 1.1  Sodium 135 - 145 mmol/L 142 144 143  Potassium 3.5 - 5.1 mmol/L 3.2(L) 3.6 3.4(L)  Chloride 101 - 111 mmol/L 108 - -  CO2 22 - 32 mmol/L 23 20(L) 21(L)  Calcium 8.9 - 10.3 mg/dL 9.1 9.2 9.0  Total Protein 6.5 - 8.1 g/dL 7.2 7.0 6.9  Total Bilirubin 0.3 - 1.2 mg/dL 0.5 0.34 0.25  Alkaline Phos 38 - 126 U/L 112 110 111  AST 15 - 41 U/L 65(H) 13 10  ALT 17 - 63 U/L 41 13 12     RADIOGRAPHIC STUDIES: No new scans    ASSESSMENT: Andres Lopez 82 y.o. male with a history of Iron deficiency anemia, unspecified iron deficiency anemia type  Bandemia - Plan: BCR-ABL1 FISH  1. Chronic Anemia NOS, likely iron deficiency -His iron study in 2014 showed normal ferritin at 210, low serum iron and saturation. -His anemia has been gradually improved since he started iron supplement. His hemoglobin today is very mildly low at 12.5. -His clinical presentation is more consistent with iron deficient anemia -GI work-up in 2014 was unremarkable making a GI bleeding source less likely. Still he does have a documented history of diverticulosis seen on colonoscopy done on 12/19/2006 due to hemoccult positivity.  -His iron study showed normal serum iron and transferrin saturation, his ferritin level was elevated at 581 in  01/2016, and l changed his ferrous sulfate 1 tab daily to every other day.  -His iron study on 07/25/17 showed ferritin 573, normal serum iron and transferrin saturation, normal TIBC. I'll called patient and let him stop iron pill, and repeat lab in 3 months.  -His iron study on 10/31/17 showed ferritin 716, low serum iron and low saturation ratios.  He has component of anemia of chronic disease.  He will continue oral iron every other day  -F/u in 3 months   2. Leukocytosis, chronic, worsening lately   -His white count is 14.0 (13.8 one year ago), with predominant neutrophils.  -This has been chronic and stable, since 2008. Likely reactive. I do not think he needs further workup.  -Today (12/19/17), his WBC is 18.2K, with predominant neutrophil, and slightly increased monocytes.  He did have a course of oral prednisone for his skin rash, he completed  a 1 week ago, which could have contributed to his worsening leukocytosis.  I do not highly suspect CML, but I will order bcr/abl today. If it's negative and his WBC is till high at next visit, I may check JAK2 mutation -Given his mild anemia and persistent leukocytosis, primary bone marrow disease, such as MDS/MPN, CMML are also possible, which will require a bone marrow biopsy   3. Tremors -He is currently followed by a neurologist for this who has placed him on Topomax. He reports that this does help with his tremors some. His neurologist will continue to monitor this for ongoing management.    PLAN: -BCR/ABL today, may add JAK2 at next visit if WBC still high, and consider bone marrow biopsy if gene mutation tests are negative  -F/u in 3 months with lab    All questions were answered. The patient knows to call the clinic with any problems, questions or concerns. We can certainly see the patient much sooner if necessary.  I spent 15 minutes counseling the patient face to face. The total time spent in the appointment was 20 minutes.  This document  serves as a record of services personally performed by Truitt Merle, MD. It was created on her behalf by Theresia Bough, a trained medical scribe. The creation of this record is based on the scribe's personal observations and the provider's statements to them.   I have reviewed the above documentation for accuracy and completeness, and I agree with the above.   Truitt Merle  12/19/2017

## 2017-12-19 ENCOUNTER — Telehealth: Payer: Self-pay | Admitting: Hematology

## 2017-12-19 ENCOUNTER — Other Ambulatory Visit: Payer: Self-pay | Admitting: *Deleted

## 2017-12-19 ENCOUNTER — Inpatient Hospital Stay: Payer: Medicare Other | Attending: Hematology | Admitting: Hematology

## 2017-12-19 ENCOUNTER — Inpatient Hospital Stay: Payer: Medicare Other

## 2017-12-19 ENCOUNTER — Encounter: Payer: Self-pay | Admitting: Hematology

## 2017-12-19 VITALS — BP 102/70 | HR 51 | Temp 98.6°F | Resp 16 | Ht 65.0 in | Wt 168.8 lb

## 2017-12-19 DIAGNOSIS — D72825 Bandemia: Secondary | ICD-10-CM

## 2017-12-19 DIAGNOSIS — D72829 Elevated white blood cell count, unspecified: Secondary | ICD-10-CM | POA: Diagnosis not present

## 2017-12-19 DIAGNOSIS — D649 Anemia, unspecified: Secondary | ICD-10-CM

## 2017-12-19 DIAGNOSIS — I10 Essential (primary) hypertension: Secondary | ICD-10-CM | POA: Diagnosis not present

## 2017-12-19 DIAGNOSIS — D509 Iron deficiency anemia, unspecified: Secondary | ICD-10-CM | POA: Diagnosis not present

## 2017-12-19 DIAGNOSIS — G250 Essential tremor: Secondary | ICD-10-CM | POA: Diagnosis not present

## 2017-12-19 LAB — CBC WITH DIFFERENTIAL/PLATELET
BASOS ABS: 0 10*3/uL (ref 0.0–0.1)
Basophils Relative: 0 %
Eosinophils Absolute: 0.2 10*3/uL (ref 0.0–0.5)
Eosinophils Relative: 1 %
HEMATOCRIT: 38.6 % (ref 38.4–49.9)
HEMOGLOBIN: 12.4 g/dL — AB (ref 13.0–17.1)
LYMPHS PCT: 17 %
Lymphs Abs: 3.1 10*3/uL (ref 0.9–3.3)
MCH: 30.7 pg (ref 27.2–33.4)
MCHC: 32.1 g/dL (ref 32.0–36.0)
MCV: 95.5 fL (ref 79.3–98.0)
Monocytes Absolute: 1.4 10*3/uL — ABNORMAL HIGH (ref 0.1–0.9)
Monocytes Relative: 7 %
NEUTROS ABS: 13.6 10*3/uL — AB (ref 1.5–6.5)
NEUTROS PCT: 75 %
Platelets: 273 10*3/uL (ref 140–400)
RBC: 4.04 MIL/uL — AB (ref 4.20–5.82)
RDW: 15.6 % — AB (ref 11.0–14.6)
WBC: 18.2 10*3/uL — AB (ref 4.0–10.3)

## 2017-12-19 LAB — IRON AND TIBC
Iron: 32 ug/dL — ABNORMAL LOW (ref 42–163)
Saturation Ratios: 13 % — ABNORMAL LOW (ref 42–163)
TIBC: 249 ug/dL (ref 202–409)
UIBC: 217 ug/dL

## 2017-12-19 LAB — RETICULOCYTES
RBC.: 4.04 MIL/uL — AB (ref 4.20–5.82)
RETIC COUNT ABSOLUTE: 48.5 10*3/uL (ref 34.8–93.9)
Retic Ct Pct: 1.2 % (ref 0.8–1.8)

## 2017-12-19 LAB — FERRITIN: Ferritin: 691 ng/mL — ABNORMAL HIGH (ref 22–316)

## 2017-12-19 NOTE — Telephone Encounter (Signed)
Scheduled appt per 2/25 los - Gave patient AVS and calender per los.  

## 2017-12-22 DIAGNOSIS — F339 Major depressive disorder, recurrent, unspecified: Secondary | ICD-10-CM | POA: Diagnosis not present

## 2017-12-22 DIAGNOSIS — G20 Parkinson's disease: Secondary | ICD-10-CM | POA: Diagnosis not present

## 2017-12-22 DIAGNOSIS — E538 Deficiency of other specified B group vitamins: Secondary | ICD-10-CM | POA: Diagnosis not present

## 2017-12-22 DIAGNOSIS — D509 Iron deficiency anemia, unspecified: Secondary | ICD-10-CM | POA: Diagnosis not present

## 2017-12-22 DIAGNOSIS — R2689 Other abnormalities of gait and mobility: Secondary | ICD-10-CM | POA: Diagnosis not present

## 2017-12-22 DIAGNOSIS — E876 Hypokalemia: Secondary | ICD-10-CM | POA: Diagnosis not present

## 2017-12-22 DIAGNOSIS — I129 Hypertensive chronic kidney disease with stage 1 through stage 4 chronic kidney disease, or unspecified chronic kidney disease: Secondary | ICD-10-CM | POA: Diagnosis not present

## 2017-12-23 ENCOUNTER — Telehealth: Payer: Self-pay

## 2017-12-23 NOTE — Telephone Encounter (Signed)
Called pt and spoke with his wife regarding pathology results. Informed them per Dr. Burr Medico his results came back normal with no cancer. No further questions at this time.  Cyndia Bent RN

## 2017-12-26 DIAGNOSIS — E538 Deficiency of other specified B group vitamins: Secondary | ICD-10-CM | POA: Diagnosis not present

## 2017-12-26 DIAGNOSIS — D509 Iron deficiency anemia, unspecified: Secondary | ICD-10-CM | POA: Diagnosis not present

## 2017-12-26 DIAGNOSIS — G20 Parkinson's disease: Secondary | ICD-10-CM | POA: Diagnosis not present

## 2017-12-26 DIAGNOSIS — F339 Major depressive disorder, recurrent, unspecified: Secondary | ICD-10-CM | POA: Diagnosis not present

## 2017-12-26 DIAGNOSIS — R2689 Other abnormalities of gait and mobility: Secondary | ICD-10-CM | POA: Diagnosis not present

## 2017-12-26 DIAGNOSIS — I129 Hypertensive chronic kidney disease with stage 1 through stage 4 chronic kidney disease, or unspecified chronic kidney disease: Secondary | ICD-10-CM | POA: Diagnosis not present

## 2017-12-28 DIAGNOSIS — E538 Deficiency of other specified B group vitamins: Secondary | ICD-10-CM | POA: Diagnosis not present

## 2017-12-28 DIAGNOSIS — I129 Hypertensive chronic kidney disease with stage 1 through stage 4 chronic kidney disease, or unspecified chronic kidney disease: Secondary | ICD-10-CM | POA: Diagnosis not present

## 2017-12-28 DIAGNOSIS — D509 Iron deficiency anemia, unspecified: Secondary | ICD-10-CM | POA: Diagnosis not present

## 2017-12-28 DIAGNOSIS — R2689 Other abnormalities of gait and mobility: Secondary | ICD-10-CM | POA: Diagnosis not present

## 2017-12-28 DIAGNOSIS — G20 Parkinson's disease: Secondary | ICD-10-CM | POA: Diagnosis not present

## 2017-12-28 DIAGNOSIS — F339 Major depressive disorder, recurrent, unspecified: Secondary | ICD-10-CM | POA: Diagnosis not present

## 2017-12-29 DIAGNOSIS — D509 Iron deficiency anemia, unspecified: Secondary | ICD-10-CM | POA: Diagnosis not present

## 2017-12-29 DIAGNOSIS — I10 Essential (primary) hypertension: Secondary | ICD-10-CM | POA: Diagnosis not present

## 2017-12-29 DIAGNOSIS — N189 Chronic kidney disease, unspecified: Secondary | ICD-10-CM | POA: Diagnosis not present

## 2017-12-29 DIAGNOSIS — G20 Parkinson's disease: Secondary | ICD-10-CM | POA: Diagnosis not present

## 2017-12-29 DIAGNOSIS — N401 Enlarged prostate with lower urinary tract symptoms: Secondary | ICD-10-CM | POA: Diagnosis not present

## 2017-12-29 DIAGNOSIS — D649 Anemia, unspecified: Secondary | ICD-10-CM | POA: Diagnosis not present

## 2017-12-29 DIAGNOSIS — I129 Hypertensive chronic kidney disease with stage 1 through stage 4 chronic kidney disease, or unspecified chronic kidney disease: Secondary | ICD-10-CM | POA: Diagnosis not present

## 2017-12-29 DIAGNOSIS — E538 Deficiency of other specified B group vitamins: Secondary | ICD-10-CM | POA: Diagnosis not present

## 2017-12-29 DIAGNOSIS — F339 Major depressive disorder, recurrent, unspecified: Secondary | ICD-10-CM | POA: Diagnosis not present

## 2017-12-29 DIAGNOSIS — N2581 Secondary hyperparathyroidism of renal origin: Secondary | ICD-10-CM | POA: Diagnosis not present

## 2017-12-29 DIAGNOSIS — R2689 Other abnormalities of gait and mobility: Secondary | ICD-10-CM | POA: Diagnosis not present

## 2017-12-30 DIAGNOSIS — R2689 Other abnormalities of gait and mobility: Secondary | ICD-10-CM | POA: Diagnosis not present

## 2017-12-30 DIAGNOSIS — E538 Deficiency of other specified B group vitamins: Secondary | ICD-10-CM | POA: Diagnosis not present

## 2017-12-30 DIAGNOSIS — F339 Major depressive disorder, recurrent, unspecified: Secondary | ICD-10-CM | POA: Diagnosis not present

## 2017-12-30 DIAGNOSIS — D509 Iron deficiency anemia, unspecified: Secondary | ICD-10-CM | POA: Diagnosis not present

## 2017-12-30 DIAGNOSIS — I129 Hypertensive chronic kidney disease with stage 1 through stage 4 chronic kidney disease, or unspecified chronic kidney disease: Secondary | ICD-10-CM | POA: Diagnosis not present

## 2017-12-30 DIAGNOSIS — G20 Parkinson's disease: Secondary | ICD-10-CM | POA: Diagnosis not present

## 2018-01-03 DIAGNOSIS — R600 Localized edema: Secondary | ICD-10-CM | POA: Diagnosis not present

## 2018-01-03 DIAGNOSIS — E876 Hypokalemia: Secondary | ICD-10-CM | POA: Diagnosis not present

## 2018-01-03 DIAGNOSIS — N401 Enlarged prostate with lower urinary tract symptoms: Secondary | ICD-10-CM | POA: Diagnosis not present

## 2018-01-03 DIAGNOSIS — I10 Essential (primary) hypertension: Secondary | ICD-10-CM | POA: Diagnosis not present

## 2018-01-04 DIAGNOSIS — E538 Deficiency of other specified B group vitamins: Secondary | ICD-10-CM | POA: Diagnosis not present

## 2018-01-04 DIAGNOSIS — I129 Hypertensive chronic kidney disease with stage 1 through stage 4 chronic kidney disease, or unspecified chronic kidney disease: Secondary | ICD-10-CM | POA: Diagnosis not present

## 2018-01-04 DIAGNOSIS — G20 Parkinson's disease: Secondary | ICD-10-CM | POA: Diagnosis not present

## 2018-01-04 DIAGNOSIS — R2689 Other abnormalities of gait and mobility: Secondary | ICD-10-CM | POA: Diagnosis not present

## 2018-01-04 DIAGNOSIS — F339 Major depressive disorder, recurrent, unspecified: Secondary | ICD-10-CM | POA: Diagnosis not present

## 2018-01-04 DIAGNOSIS — D509 Iron deficiency anemia, unspecified: Secondary | ICD-10-CM | POA: Diagnosis not present

## 2018-01-05 DIAGNOSIS — G20 Parkinson's disease: Secondary | ICD-10-CM | POA: Diagnosis not present

## 2018-01-05 DIAGNOSIS — F339 Major depressive disorder, recurrent, unspecified: Secondary | ICD-10-CM | POA: Diagnosis not present

## 2018-01-05 DIAGNOSIS — E538 Deficiency of other specified B group vitamins: Secondary | ICD-10-CM | POA: Diagnosis not present

## 2018-01-05 DIAGNOSIS — D509 Iron deficiency anemia, unspecified: Secondary | ICD-10-CM | POA: Diagnosis not present

## 2018-01-05 DIAGNOSIS — R2689 Other abnormalities of gait and mobility: Secondary | ICD-10-CM | POA: Diagnosis not present

## 2018-01-05 DIAGNOSIS — I129 Hypertensive chronic kidney disease with stage 1 through stage 4 chronic kidney disease, or unspecified chronic kidney disease: Secondary | ICD-10-CM | POA: Diagnosis not present

## 2018-01-06 DIAGNOSIS — R2689 Other abnormalities of gait and mobility: Secondary | ICD-10-CM | POA: Diagnosis not present

## 2018-01-06 DIAGNOSIS — I129 Hypertensive chronic kidney disease with stage 1 through stage 4 chronic kidney disease, or unspecified chronic kidney disease: Secondary | ICD-10-CM | POA: Diagnosis not present

## 2018-01-06 DIAGNOSIS — G20 Parkinson's disease: Secondary | ICD-10-CM | POA: Diagnosis not present

## 2018-01-06 DIAGNOSIS — F339 Major depressive disorder, recurrent, unspecified: Secondary | ICD-10-CM | POA: Diagnosis not present

## 2018-01-06 DIAGNOSIS — E538 Deficiency of other specified B group vitamins: Secondary | ICD-10-CM | POA: Diagnosis not present

## 2018-01-06 DIAGNOSIS — D509 Iron deficiency anemia, unspecified: Secondary | ICD-10-CM | POA: Diagnosis not present

## 2018-01-09 DIAGNOSIS — D509 Iron deficiency anemia, unspecified: Secondary | ICD-10-CM | POA: Diagnosis not present

## 2018-01-09 DIAGNOSIS — I129 Hypertensive chronic kidney disease with stage 1 through stage 4 chronic kidney disease, or unspecified chronic kidney disease: Secondary | ICD-10-CM | POA: Diagnosis not present

## 2018-01-09 DIAGNOSIS — G20 Parkinson's disease: Secondary | ICD-10-CM | POA: Diagnosis not present

## 2018-01-09 DIAGNOSIS — R2689 Other abnormalities of gait and mobility: Secondary | ICD-10-CM | POA: Diagnosis not present

## 2018-01-09 DIAGNOSIS — E538 Deficiency of other specified B group vitamins: Secondary | ICD-10-CM | POA: Diagnosis not present

## 2018-01-09 DIAGNOSIS — F339 Major depressive disorder, recurrent, unspecified: Secondary | ICD-10-CM | POA: Diagnosis not present

## 2018-01-10 DIAGNOSIS — R600 Localized edema: Secondary | ICD-10-CM | POA: Diagnosis not present

## 2018-01-10 DIAGNOSIS — G20 Parkinson's disease: Secondary | ICD-10-CM | POA: Diagnosis not present

## 2018-01-10 DIAGNOSIS — F339 Major depressive disorder, recurrent, unspecified: Secondary | ICD-10-CM | POA: Diagnosis not present

## 2018-01-10 DIAGNOSIS — E538 Deficiency of other specified B group vitamins: Secondary | ICD-10-CM | POA: Diagnosis not present

## 2018-01-10 DIAGNOSIS — D509 Iron deficiency anemia, unspecified: Secondary | ICD-10-CM | POA: Diagnosis not present

## 2018-01-10 DIAGNOSIS — N189 Chronic kidney disease, unspecified: Secondary | ICD-10-CM | POA: Diagnosis not present

## 2018-01-10 DIAGNOSIS — I129 Hypertensive chronic kidney disease with stage 1 through stage 4 chronic kidney disease, or unspecified chronic kidney disease: Secondary | ICD-10-CM | POA: Diagnosis not present

## 2018-01-10 DIAGNOSIS — R2689 Other abnormalities of gait and mobility: Secondary | ICD-10-CM | POA: Diagnosis not present

## 2018-01-10 DIAGNOSIS — D72829 Elevated white blood cell count, unspecified: Secondary | ICD-10-CM | POA: Diagnosis not present

## 2018-01-10 DIAGNOSIS — I10 Essential (primary) hypertension: Secondary | ICD-10-CM | POA: Diagnosis not present

## 2018-01-10 LAB — BCR ABL1 FISH (GENPATH)

## 2018-01-11 ENCOUNTER — Telehealth: Payer: Self-pay | Admitting: *Deleted

## 2018-01-11 DIAGNOSIS — F339 Major depressive disorder, recurrent, unspecified: Secondary | ICD-10-CM | POA: Diagnosis not present

## 2018-01-11 DIAGNOSIS — G20 Parkinson's disease: Secondary | ICD-10-CM | POA: Diagnosis not present

## 2018-01-11 DIAGNOSIS — R2689 Other abnormalities of gait and mobility: Secondary | ICD-10-CM | POA: Diagnosis not present

## 2018-01-11 DIAGNOSIS — I129 Hypertensive chronic kidney disease with stage 1 through stage 4 chronic kidney disease, or unspecified chronic kidney disease: Secondary | ICD-10-CM | POA: Diagnosis not present

## 2018-01-11 DIAGNOSIS — E538 Deficiency of other specified B group vitamins: Secondary | ICD-10-CM | POA: Diagnosis not present

## 2018-01-11 DIAGNOSIS — D509 Iron deficiency anemia, unspecified: Secondary | ICD-10-CM | POA: Diagnosis not present

## 2018-01-11 NOTE — Telephone Encounter (Signed)
Received call from Dr [facility] office/[facility] asking for report of last OV with Dr Burr Medico 12/19/17. This was faxed to [phone].

## 2018-01-13 DIAGNOSIS — I129 Hypertensive chronic kidney disease with stage 1 through stage 4 chronic kidney disease, or unspecified chronic kidney disease: Secondary | ICD-10-CM | POA: Diagnosis not present

## 2018-01-13 DIAGNOSIS — G20 Parkinson's disease: Secondary | ICD-10-CM | POA: Diagnosis not present

## 2018-01-13 DIAGNOSIS — E538 Deficiency of other specified B group vitamins: Secondary | ICD-10-CM | POA: Diagnosis not present

## 2018-01-13 DIAGNOSIS — R2689 Other abnormalities of gait and mobility: Secondary | ICD-10-CM | POA: Diagnosis not present

## 2018-01-13 DIAGNOSIS — F339 Major depressive disorder, recurrent, unspecified: Secondary | ICD-10-CM | POA: Diagnosis not present

## 2018-01-13 DIAGNOSIS — D509 Iron deficiency anemia, unspecified: Secondary | ICD-10-CM | POA: Diagnosis not present

## 2018-01-16 DIAGNOSIS — H353121 Nonexudative age-related macular degeneration, left eye, early dry stage: Secondary | ICD-10-CM | POA: Diagnosis not present

## 2018-01-16 DIAGNOSIS — H26493 Other secondary cataract, bilateral: Secondary | ICD-10-CM | POA: Diagnosis not present

## 2018-01-16 DIAGNOSIS — H524 Presbyopia: Secondary | ICD-10-CM | POA: Diagnosis not present

## 2018-01-17 DIAGNOSIS — E538 Deficiency of other specified B group vitamins: Secondary | ICD-10-CM | POA: Diagnosis not present

## 2018-01-17 DIAGNOSIS — I129 Hypertensive chronic kidney disease with stage 1 through stage 4 chronic kidney disease, or unspecified chronic kidney disease: Secondary | ICD-10-CM | POA: Diagnosis not present

## 2018-01-17 DIAGNOSIS — F339 Major depressive disorder, recurrent, unspecified: Secondary | ICD-10-CM | POA: Diagnosis not present

## 2018-01-17 DIAGNOSIS — G20 Parkinson's disease: Secondary | ICD-10-CM | POA: Diagnosis not present

## 2018-01-17 DIAGNOSIS — R2689 Other abnormalities of gait and mobility: Secondary | ICD-10-CM | POA: Diagnosis not present

## 2018-01-17 DIAGNOSIS — D509 Iron deficiency anemia, unspecified: Secondary | ICD-10-CM | POA: Diagnosis not present

## 2018-01-18 DIAGNOSIS — I129 Hypertensive chronic kidney disease with stage 1 through stage 4 chronic kidney disease, or unspecified chronic kidney disease: Secondary | ICD-10-CM | POA: Diagnosis not present

## 2018-01-18 DIAGNOSIS — E538 Deficiency of other specified B group vitamins: Secondary | ICD-10-CM | POA: Diagnosis not present

## 2018-01-18 DIAGNOSIS — D509 Iron deficiency anemia, unspecified: Secondary | ICD-10-CM | POA: Diagnosis not present

## 2018-01-18 DIAGNOSIS — G20 Parkinson's disease: Secondary | ICD-10-CM | POA: Diagnosis not present

## 2018-01-18 DIAGNOSIS — F339 Major depressive disorder, recurrent, unspecified: Secondary | ICD-10-CM | POA: Diagnosis not present

## 2018-01-18 DIAGNOSIS — R2689 Other abnormalities of gait and mobility: Secondary | ICD-10-CM | POA: Diagnosis not present

## 2018-01-19 DIAGNOSIS — R2689 Other abnormalities of gait and mobility: Secondary | ICD-10-CM | POA: Diagnosis not present

## 2018-01-19 DIAGNOSIS — I129 Hypertensive chronic kidney disease with stage 1 through stage 4 chronic kidney disease, or unspecified chronic kidney disease: Secondary | ICD-10-CM | POA: Diagnosis not present

## 2018-01-19 DIAGNOSIS — E538 Deficiency of other specified B group vitamins: Secondary | ICD-10-CM | POA: Diagnosis not present

## 2018-01-19 DIAGNOSIS — G20 Parkinson's disease: Secondary | ICD-10-CM | POA: Diagnosis not present

## 2018-01-19 DIAGNOSIS — F339 Major depressive disorder, recurrent, unspecified: Secondary | ICD-10-CM | POA: Diagnosis not present

## 2018-01-19 DIAGNOSIS — D509 Iron deficiency anemia, unspecified: Secondary | ICD-10-CM | POA: Diagnosis not present

## 2018-01-23 DIAGNOSIS — G20 Parkinson's disease: Secondary | ICD-10-CM | POA: Diagnosis not present

## 2018-01-23 DIAGNOSIS — E538 Deficiency of other specified B group vitamins: Secondary | ICD-10-CM | POA: Diagnosis not present

## 2018-01-23 DIAGNOSIS — R2689 Other abnormalities of gait and mobility: Secondary | ICD-10-CM | POA: Diagnosis not present

## 2018-01-23 DIAGNOSIS — F339 Major depressive disorder, recurrent, unspecified: Secondary | ICD-10-CM | POA: Diagnosis not present

## 2018-01-23 DIAGNOSIS — I129 Hypertensive chronic kidney disease with stage 1 through stage 4 chronic kidney disease, or unspecified chronic kidney disease: Secondary | ICD-10-CM | POA: Diagnosis not present

## 2018-01-23 DIAGNOSIS — D509 Iron deficiency anemia, unspecified: Secondary | ICD-10-CM | POA: Diagnosis not present

## 2018-01-24 DIAGNOSIS — I129 Hypertensive chronic kidney disease with stage 1 through stage 4 chronic kidney disease, or unspecified chronic kidney disease: Secondary | ICD-10-CM | POA: Diagnosis not present

## 2018-01-24 DIAGNOSIS — D509 Iron deficiency anemia, unspecified: Secondary | ICD-10-CM | POA: Diagnosis not present

## 2018-01-24 DIAGNOSIS — F339 Major depressive disorder, recurrent, unspecified: Secondary | ICD-10-CM | POA: Diagnosis not present

## 2018-01-24 DIAGNOSIS — E538 Deficiency of other specified B group vitamins: Secondary | ICD-10-CM | POA: Diagnosis not present

## 2018-01-24 DIAGNOSIS — R2689 Other abnormalities of gait and mobility: Secondary | ICD-10-CM | POA: Diagnosis not present

## 2018-01-24 DIAGNOSIS — G20 Parkinson's disease: Secondary | ICD-10-CM | POA: Diagnosis not present

## 2018-01-25 DIAGNOSIS — E538 Deficiency of other specified B group vitamins: Secondary | ICD-10-CM | POA: Diagnosis not present

## 2018-01-25 DIAGNOSIS — R2689 Other abnormalities of gait and mobility: Secondary | ICD-10-CM | POA: Diagnosis not present

## 2018-01-25 DIAGNOSIS — F339 Major depressive disorder, recurrent, unspecified: Secondary | ICD-10-CM | POA: Diagnosis not present

## 2018-01-25 DIAGNOSIS — G20 Parkinson's disease: Secondary | ICD-10-CM | POA: Diagnosis not present

## 2018-01-25 DIAGNOSIS — I129 Hypertensive chronic kidney disease with stage 1 through stage 4 chronic kidney disease, or unspecified chronic kidney disease: Secondary | ICD-10-CM | POA: Diagnosis not present

## 2018-01-25 DIAGNOSIS — D509 Iron deficiency anemia, unspecified: Secondary | ICD-10-CM | POA: Diagnosis not present

## 2018-01-31 DIAGNOSIS — E538 Deficiency of other specified B group vitamins: Secondary | ICD-10-CM | POA: Diagnosis not present

## 2018-01-31 DIAGNOSIS — G20 Parkinson's disease: Secondary | ICD-10-CM | POA: Diagnosis not present

## 2018-01-31 DIAGNOSIS — D509 Iron deficiency anemia, unspecified: Secondary | ICD-10-CM | POA: Diagnosis not present

## 2018-01-31 DIAGNOSIS — R2689 Other abnormalities of gait and mobility: Secondary | ICD-10-CM | POA: Diagnosis not present

## 2018-01-31 DIAGNOSIS — I129 Hypertensive chronic kidney disease with stage 1 through stage 4 chronic kidney disease, or unspecified chronic kidney disease: Secondary | ICD-10-CM | POA: Diagnosis not present

## 2018-01-31 DIAGNOSIS — F339 Major depressive disorder, recurrent, unspecified: Secondary | ICD-10-CM | POA: Diagnosis not present

## 2018-02-22 DIAGNOSIS — R2689 Other abnormalities of gait and mobility: Secondary | ICD-10-CM | POA: Diagnosis not present

## 2018-02-22 DIAGNOSIS — D509 Iron deficiency anemia, unspecified: Secondary | ICD-10-CM | POA: Diagnosis not present

## 2018-02-22 DIAGNOSIS — G20 Parkinson's disease: Secondary | ICD-10-CM | POA: Diagnosis not present

## 2018-02-22 DIAGNOSIS — I129 Hypertensive chronic kidney disease with stage 1 through stage 4 chronic kidney disease, or unspecified chronic kidney disease: Secondary | ICD-10-CM | POA: Diagnosis not present

## 2018-03-16 NOTE — Progress Notes (Signed)
[facility]AT[facility] OFFICE PROGRESS NOTE Date of Visit: 03/17/2018   [name], [place] [address]  DIAGNOSIS: Iron deficiency anemia, unspecified iron deficiency anemia type  CURRENT TREATMENT: Ferrous sulfate 325 mg once every other daily.   INTERVAL HISTORY: Andres Lopez is here for follow up. He presents to the office with his wife today. He is doing well overall. He denies recent infections.   Labs today (03/17/18) of CBC, Reticulocytes, Ferritin, Iron, JAK2 is as follows:all values are WNL except for CBC with WBC at 21.6 and Hgb at 12.3. Reticulocytes with RBC at 4. Ferritin, Iron, and JAK2 PENDING today.   On review of systems, he reports tremors, intermittent fatigue. He notes that he sit in his chair at home and his wife helps him with his daily activities. He notes that he will start walking the driveway daily and he has done Physical therapy in the past. he denies night sweats, weight loss, and any other symptoms. Pertinent positives are listed and detailed within the above HPI.   MEDICAL HISTORY: Past Medical History:  Diagnosis Date  . Essential tremor 01/15/2014  . Hypertension     INTERIM HISTORY: has Dehydration; Hypokalemia; Anemia; HTN (hypertension); Leukocytosis; Essential tremor; Iron deficiency anemia; and Mixed action and resting tremor on their problem list.    ALLERGIES:  is allergic to tramadol.  MEDICATIONS:  Allergies as of 03/17/2018      Reactions   Tramadol Other (See Comments)   "makes him crazy"      Medication List        Accurate as of 03/17/18  2:09 PM. Always use your most recent med list.          ascorbic acid 500 MG tablet Commonly known as:  VITAMIN C Take 500 mg by mouth daily.   aspirin EC 81 MG tablet Take 81 mg by mouth at bedtime.   atorvastatin 80 MG tablet Commonly known as:  LIPITOR Take 40 mg by mouth every other day.   ferrous sulfate 325 (65 FE) MG  tablet Take 325 mg by mouth every other day.   furosemide 40 MG tablet Commonly known as:  LASIX Take 40 mg by mouth 2 (two) times daily.   losartan 100 MG tablet Commonly known as:  COZAAR Take 50 mg by mouth.   propranolol ER 120 MG 24 hr capsule Commonly known as:  INDERAL LA Take 1 capsule (120 mg total) by mouth daily.   sertraline 50 MG tablet Commonly known as:  ZOLOFT Take 50 mg by mouth daily.   TROKENDI XR 25 MG Cp24 Generic drug:  Topiramate ER Take 50 mg by mouth 2 (two) times daily.       SURGICAL HISTORY:  Past Surgical History:  Procedure Laterality Date  . TUMOR REMOVAL     neck    REVIEW OF SYSTEMS:   Constitutional: Denies fevers, chills or abnormal weight loss Eyes: Denies blurriness of vision Ears, nose, mouth, throat, and face: Denies mucositis or sore throat Respiratory: Denies cough, dyspnea or wheezes Cardiovascular: Denies palpitation, chest discomfort or lower extremity swelling Gastrointestinal:  Denies nausea, heartburn or change in bowel habits Skin: Denies abnormal skin rashes Lymphatics: Denies new lymphadenopathy or easy bruising Neurological:Denies numbness, tingling or new weaknesses Behavioral/Psych: Mood is stable, no new changes  All other systems were reviewed with the patient and are negative.  PHYSICAL EXAMINATION:  ECOG PERFORMANCE STATUS: 2  Blood pressure (!) 136/57, pulse 60, temperature  42 F (36.7 C), temperature source Oral, resp. rate 18, height _0  (1.651 m), weight 175 lb 4.8 oz (79.5 kg), SpO2 99 %.  GENERAL:alert, no distress and comfortable; tremors but otherwise gets to table without difficulty. Mildly overweight.  SKIN: skin color, texture, turgor are normal, no rashes or significant lesions EYES: normal, Conjunctiva are pink and non-injected, sclera clear OROPHARYNX:no exudate, no erythema and lips, buccal mucosa, and tongue normal  NECK: supple, thyroid normal size, non-tender, without  nodularity LYMPH:  no palpable lymphadenopathy in the cervical, axillary or supraclavicular LUNGS: clear to auscultation with normal breathing effort, no wheezes or rhonchi HEART: regular rate & rhythm and no murmurs. There is bilateral pitting edema up to knee.  ABDOMEN:abdomen soft, non-tender and normal bowel sounds Musculoskeletal:no cyanosis of digits and no clubbing  NEURO: alert & oriented x 3 with fluent speech, no focal motor/sensory deficits  Labs:  CBC Latest Ref Rng & Units 03/17/2018 12/19/2017 10/31/2017  WBC 4.0 - 10.3 K/uL 21.6(H) 18.2(H) 16.4(H)  Hemoglobin 13.0 - 17.1 g/dL 12.3(L) 12.4(L) 11.9(L)  Hematocrit 38.4 - 49.9 % 38.3(L) 38.6 36.7(L)  Platelets 140 - 400 K/uL 315 273 303    CMP Latest Ref Rng & Units 02/21/2017 08/11/2015 02/10/2015  Glucose 65 - 99 mg/dL 120(H) 106 111  BUN 6 - 20 mg/dL 17 15.1 15.0  Creatinine 0.61 - 1.24 mg/dL 1.25(H) 1.2 1.1  Sodium 135 - 145 mmol/L 142 144 143  Potassium 3.5 - 5.1 mmol/L 3.2(L) 3.6 3.4(L)  Chloride 101 - 111 mmol/L 108 - -  CO2 22 - 32 mmol/L 23 20(L) 21(L)  Calcium 8.9 - 10.3 mg/dL 9.1 9.2 9.0  Total Protein 6.5 - 8.1 g/dL 7.2 7.0 6.9  Total Bilirubin 0.3 - 1.2 mg/dL 0.5 0.34 0.25  Alkaline Phos 38 - 126 U/L 112 110 111  AST 15 - 41 U/L 65(H) 13 10  ALT 17 - 63 U/L 41 13 12     RADIOGRAPHIC STUDIES: No new scans    ASSESSMENT: Andres Lopez 82 y.o. male with a history of Iron deficiency anemia, unspecified iron deficiency anemia type  1. Chronic Anemia NOS, likely iron deficiency -His iron study in 2014 showed normal ferritin at 210, low serum iron and saturation. -His anemia has been gradually improved since he started iron supplement.  -His clinical presentation is more consistent with iron deficient anemia -GI work-up in 2014 was unremarkable making a GI bleeding source less likely. Still he does have a documented history of diverticulosis seen on colonoscopy done on 12/19/2006 due to hemoccult positivity.   -His iron study showed normal serum iron and transferrin saturation, his ferritin level was elevated at 581 in 01/2016, and l changed his ferrous sulfate 1 tab daily to every other day.  -His iron study on 07/25/17 showed ferritin 573, normal serum iron and transferrin saturation, normal TIBC. I'll called patient and let him stop iron pill, and repeat lab in 3 months.  -His iron study on 10/31/17 showed ferritin 716, low serum iron and low saturation ratios. He has component of anemia of chronic disease.  He will continue oral iron every other day  -Reviewed labs today with patient and his wife, 03/17/2018 that showed: CBC, Reticulocytes, Ferritin, Iron, JAK2 is as follows:all values are WNL except for CBC with WBC at 21.6 and Hgb at 12.3. Reticulocytes with RBC at 4. Ferritin, Iron, and JAK2 PENDING today.   -Informed the patient that if the JAK2 labs return abnormal, then I will see the  patient back earlier.  -Labs and f/u in 3 months  2. Leukocytosis, chronic, worsening lately   -His white count is 14.0 (13.8 one year ago), with predominant neutrophils.  -This has been chronic since 2008. Likely reactive. -due to his worsening leukocytosis, with predominant neutrophils, I ordered BCR/ABL FISH on last visit, which came back negative -His WBC today, 03/17/2018 is at 21.6 and neutro abs at 15.7, worse than last time  -I will obtain JAK2 and MPN panel today. If positive, I will recommend a bone marrow biopsy  -follow up in 3 months for labs to reevaluate chronic leukocytosis  3. Tremors -He is currently followed by a neurologist for this who has placed him on Topomax. He reports that this does help with his tremors some. His neurologist will continue to monitor this for ongoing management.    PLAN: -Labs and f/u in 3 months, or sooner if his JAK2 and MPN panel returns positive      All questions were answered. The patient knows to call the clinic with any problems, questions or concerns. We can  certainly see the patient much sooner if necessary.  I spent 15 minutes counseling the patient face to face. The total time spent in the appointment was 20 minutes.  This document serves as a record of services personally performed by Truitt Merle, MD. It was created on her behalf by Steva Colder, a trained medical scribe. The creation of this record is based on the scribe's personal observations and the provider's statements to them.   I have reviewed the above documentation for accuracy and completeness, and I agree with the above.   Truitt Merle  03/17/2018

## 2018-03-17 ENCOUNTER — Inpatient Hospital Stay: Payer: Medicare Other

## 2018-03-17 ENCOUNTER — Inpatient Hospital Stay: Payer: Medicare Other | Attending: Hematology | Admitting: Hematology

## 2018-03-17 ENCOUNTER — Telehealth: Payer: Self-pay | Admitting: Hematology

## 2018-03-17 ENCOUNTER — Other Ambulatory Visit: Payer: Self-pay | Admitting: Hematology

## 2018-03-17 VITALS — BP 136/57 | HR 60 | Temp 98.0°F | Resp 18 | Ht 65.0 in | Wt 175.3 lb

## 2018-03-17 DIAGNOSIS — R251 Tremor, unspecified: Secondary | ICD-10-CM | POA: Diagnosis not present

## 2018-03-17 DIAGNOSIS — I129 Hypertensive chronic kidney disease with stage 1 through stage 4 chronic kidney disease, or unspecified chronic kidney disease: Secondary | ICD-10-CM | POA: Diagnosis not present

## 2018-03-17 DIAGNOSIS — D72825 Bandemia: Secondary | ICD-10-CM | POA: Diagnosis not present

## 2018-03-17 DIAGNOSIS — D509 Iron deficiency anemia, unspecified: Secondary | ICD-10-CM | POA: Diagnosis not present

## 2018-03-17 DIAGNOSIS — I10 Essential (primary) hypertension: Secondary | ICD-10-CM

## 2018-03-17 DIAGNOSIS — D649 Anemia, unspecified: Secondary | ICD-10-CM | POA: Diagnosis not present

## 2018-03-17 DIAGNOSIS — D72829 Elevated white blood cell count, unspecified: Secondary | ICD-10-CM | POA: Insufficient documentation

## 2018-03-17 DIAGNOSIS — G20 Parkinson's disease: Secondary | ICD-10-CM | POA: Diagnosis not present

## 2018-03-17 DIAGNOSIS — R2689 Other abnormalities of gait and mobility: Secondary | ICD-10-CM | POA: Diagnosis not present

## 2018-03-17 LAB — IRON AND TIBC
Iron: 16 ug/dL — ABNORMAL LOW (ref 42–163)
Saturation Ratios: 11 % — ABNORMAL LOW (ref 42–163)
TIBC: 151 ug/dL — ABNORMAL LOW (ref 202–409)
UIBC: 135 ug/dL

## 2018-03-17 LAB — FERRITIN: FERRITIN: 723 ng/mL — AB (ref 22–316)

## 2018-03-17 LAB — CBC WITH DIFFERENTIAL (CANCER CENTER ONLY)
BASOS ABS: 0 10*3/uL (ref 0.0–0.1)
BASOS PCT: 0 %
EOS ABS: 0.2 10*3/uL (ref 0.0–0.5)
EOS PCT: 1 %
HCT: 38.3 % — ABNORMAL LOW (ref 38.4–49.9)
HEMOGLOBIN: 12.3 g/dL — AB (ref 13.0–17.1)
LYMPHS ABS: 3.7 10*3/uL — AB (ref 0.9–3.3)
Lymphocytes Relative: 17 %
MCH: 30.8 pg (ref 27.2–33.4)
MCHC: 32.1 g/dL (ref 32.0–36.0)
MCV: 95.8 fL (ref 79.3–98.0)
Monocytes Absolute: 1.9 10*3/uL — ABNORMAL HIGH (ref 0.1–0.9)
Monocytes Relative: 9 %
NEUTROS PCT: 73 %
Neutro Abs: 15.7 10*3/uL — ABNORMAL HIGH (ref 1.5–6.5)
PLATELETS: 315 10*3/uL (ref 140–400)
RBC: 4 MIL/uL — AB (ref 4.20–5.82)
RDW: 15.3 % — ABNORMAL HIGH (ref 11.0–14.6)
WBC: 21.6 10*3/uL — AB (ref 4.0–10.3)

## 2018-03-17 LAB — RETICULOCYTES
RBC.: 4 MIL/uL — AB (ref 4.20–5.82)
RETIC COUNT ABSOLUTE: 48 10*3/uL (ref 34.8–93.9)
Retic Ct Pct: 1.2 % (ref 0.8–1.8)

## 2018-03-17 NOTE — Telephone Encounter (Signed)
Scheduled appt per 5/24 los - Gave patient AVS and calender per los.

## 2018-03-19 ENCOUNTER — Encounter: Payer: Self-pay | Admitting: Hematology

## 2018-03-23 ENCOUNTER — Telehealth: Payer: Self-pay | Admitting: Hematology

## 2018-03-23 NOTE — Telephone Encounter (Signed)
I called patient, and discussed his recent lab results with his wife.  His genetic mutation on Jak2, MPL, IDH, etc.(MPN panel) was negative.  I think his leukocytosis is likely reactive, much less likely MPN.  I do not think he needs a bone marrow biopsy for now.  We will see him back in 3 months for lab and follow-up.  She voiced good understanding and appreciated the call.  Truitt Merle  03/23/2018

## 2018-03-28 LAB — JAK2 (INCLUDING V617F AND EXON 12), MPL,& CALR W/RFL MPN PANEL (NGS)

## 2018-05-11 DIAGNOSIS — R600 Localized edema: Secondary | ICD-10-CM | POA: Diagnosis not present

## 2018-06-14 ENCOUNTER — Other Ambulatory Visit: Payer: Self-pay

## 2018-06-14 MED ORDER — TOPIRAMATE 50 MG PO TABS: 100.0000 mg | ORAL_TABLET | Freq: Two times a day (BID) | ORAL | 3 refills | Status: DC

## 2018-06-14 NOTE — Telephone Encounter (Signed)
I received a request from Andres Lopez for pt's topamax 50mg  2 tablets BID.   I called pt to verify that he is still taking the topamax as prescribed. The topamax had been taken off of pt's med list.  I spoke to pt's wife, Andres Lopez, per [place]. She verifies that pt is taking topamax 50 mg 2 tablets BID. Will send refill to [facility]. Pt's wife verbalized understanding.

## 2018-06-15 NOTE — Progress Notes (Signed)
[facility]EMATOLOGY OFFICE PROGRESS NOTE Date of Visit: 06/16/2018   Andres Lopez, [place] [address]  CC:  F/u for Anemia and leukocytosis   CURRENT TREATMENT: Ferrous sulfate 325 mg once every other day  INTERVAL HISTORY:  Andres Lopez is here for follow up of anemia and leukocytosis. He presents to the office with his wife today. He notes he feels the same and doing well overall. He notes he has some good days and some bad days where he has difficulty walker or moving. He does not really exercise. His wife remains vital in helping him. He continues to ambulate with walker.      MEDICAL HISTORY: Past Medical History:  Diagnosis Date  . Essential tremor 01/15/2014  . Hypertension     INTERIM HISTORY: has Dehydration; Hypokalemia; Anemia; HTN (hypertension); Leukocytosis; Essential tremor; Iron deficiency anemia; and Mixed action and resting tremor on their problem list.    ALLERGIES:  is allergic to tramadol.  MEDICATIONS:  Allergies as of 06/16/2018      Reactions   Tramadol Other (See Comments)   "makes him crazy"      Medication List        Accurate as of 06/16/18  2:03 PM. Always use your most recent med list.          ascorbic acid 500 MG tablet Commonly known as:  VITAMIN C Take 500 mg by mouth daily.   aspirin EC 81 MG tablet Take 81 mg by mouth at bedtime.   atorvastatin 80 MG tablet Commonly known as:  LIPITOR Take 40 mg by mouth every other day.   ferrous sulfate 325 (65 FE) MG tablet Take 325 mg by mouth every other day.   furosemide 40 MG tablet Commonly known as:  LASIX Take 40 mg by mouth 2 (two) times daily.   losartan 100 MG tablet Commonly known as:  COZAAR Take 50 mg by mouth.   propranolol ER 120 MG 24 hr capsule Commonly known as:  INDERAL LA Take 1 capsule (120 mg total) by mouth daily.   sertraline 50 MG tablet Commonly known as:  ZOLOFT Take 50 mg by mouth daily.     TROKENDI XR 25 MG Cp24 Generic drug:  Topiramate ER Take 50 mg by mouth 2 (two) times daily.   topiramate 50 MG tablet Commonly known as:  TOPAMAX Take 2 tablets (100 mg total) by mouth 2 (two) times daily.       SURGICAL HISTORY:  Past Surgical History:  Procedure Laterality Date  . TUMOR REMOVAL     neck    REVIEW OF SYSTEMS:   Constitutional: Denies fevers, chills or abnormal weight loss Eyes: Denies blurriness of vision Ears, nose, mouth, throat, and face: Denies mucositis or sore throat Respiratory: Denies cough, dyspnea or wheezes Cardiovascular: Denies palpitation, chest discomfort or lower extremity swelling Gastrointestinal:  Denies nausea, heartburn or change in bowel habits Skin: Denies abnormal skin rashes Lymphatics: Denies new lymphadenopathy or easy bruising Neurological:Denies numbness, tingling or new weaknesses (+) tremors (+) ambulates with walker  Behavioral/Psych: Mood is stable, no new changes  All other systems were reviewed with the patient and are negative.  PHYSICAL EXAMINATION:  ECOG PERFORMANCE STATUS: 2  Blood pressure 124/63, pulse (!) 58, temperature 98.1 F (36.7 C), temperature source Oral, resp. rate 17, height _0  (1.651 m), weight 174 lb 12.8 oz (79.3 kg), SpO2 98 %.  GENERAL:alert, no distress and comfortable; tremors but otherwise  gets to table without difficulty. Mildly overweight.  SKIN: skin color, texture, turgor are normal, no rashes or significant lesions EYES: normal, Conjunctiva are pink and non-injected, sclera clear OROPHARYNX:no exudate, no erythema and lips, buccal mucosa, and tongue normal  NECK: supple, thyroid normal size, non-tender, without nodularity LYMPH:  no palpable lymphadenopathy in the cervical, axillary or supraclavicular LUNGS: clear to auscultation with normal breathing effort, no wheezes or rhonchi HEART: regular rate & rhythm and no murmurs. There is bilateral pitting edema up to knee.   ABDOMEN:abdomen soft, non-tender and normal bowel sounds Musculoskeletal:no cyanosis of digits and no clubbing  NEURO: alert & oriented x 3 with fluent speech, no focal motor/sensory deficits  Labs:  CBC Latest Ref Rng & Units 06/16/2018 03/17/2018 12/19/2017  WBC 4.0 - 10.3 K/uL 14.2(H) 21.6(H) 18.2(H)  Hemoglobin 13.0 - 17.1 g/dL 12.3(L) 12.3(L) 12.4(L)  Hematocrit 38.4 - 49.9 % 37.8(L) 38.3(L) 38.6  Platelets 140 - 400 K/uL 357 315 273    CMP Latest Ref Rng & Units 02/21/2017 08/11/2015 02/10/2015  Glucose 65 - 99 mg/dL 120(H) 106 111  BUN 6 - 20 mg/dL 17 15.1 15.0  Creatinine 0.61 - 1.24 mg/dL 1.25(H) 1.2 1.1  Sodium 135 - 145 mmol/L 142 144 143  Potassium 3.5 - 5.1 mmol/L 3.2(L) 3.6 3.4(L)  Chloride 101 - 111 mmol/L 108 - -  CO2 22 - 32 mmol/L 23 20(L) 21(L)  Calcium 8.9 - 10.3 mg/dL 9.1 9.2 9.0  Total Protein 6.5 - 8.1 g/dL 7.2 7.0 6.9  Total Bilirubin 0.3 - 1.2 mg/dL 0.5 0.34 0.25  Alkaline Phos 38 - 126 U/L 112 110 111  AST 15 - 41 U/L 65(H) 13 10  ALT 17 - 63 U/L 41 13 12     RADIOGRAPHIC STUDIES: No new scans    ASSESSMENT: Andres Lopez 82 y.o. male with   1. Chronic Anemia NOS, iron deficiency and anemia of chronic disease  -His iron study in 2014 showed normal ferritin at 210, low serum iron and saturation. -His anemia has been gradually improved since he started iron supplement.  -His clinical presentation is more consistent with iron deficient anemia -GI work-up in 2014 was unremarkable making a GI bleeding source less likely. Still he does have a documented history of diverticulosis seen on colonoscopy done on 12/19/2006 due to hemoccult positivity.  -His iron study showed normal serum iron and transferrin saturation, his ferritin level was elevated at 581 in 01/2016, and l changed his ferrous sulfate 1 tab daily to every other day.  -His iron study on 10/31/17 showed ferritin 716, low serum iron and low saturation ratios. He has component of anemia of chronic  disease. He will continue oral iron every other day  -His Jak2 test was negative -Today's Labs reviewed, Retic ct normal, Hg stable at 12.3, WBC improved to 14.2, PLT normal. Given his counts continue to hold up well, I will monitor less often with labs every 6 months and f/u with him in 1 year. In interim he can follow up with his other physicians.  -He will continue oral iron   2. Leukocytosis, chronic -This has been chronic since 2008. Likely reactive. -due to his worsening leukocytosis, with predominant neutrophils, I previously ordered BCR/ABL FISH, which came back negative -His genetic mutation on Jak2, MPL, IDH, etc. (MPN panel) was negative. I think his leukocytosis is likely reactive, much less likely MPN. I do not think he needs a bone marrow biopsy for now. -WBC at 14.2, ANC at 9.9  today (06/16/18)  3. Tremors -He is currently followed by a neurologist for this who has placed him on Topomax. He reports that this does help with his tremors some. His neurologist will continue to monitor this for ongoing management.  -Ambulates with walker -He is managing moderately well overall. I encouraged him to be active with light exercise.    PLAN: -Lab in 6 months  -Lab and f/u in 12 months     All questions were answered. The patient knows to call the clinic with any problems, questions or concerns. We can certainly see the patient much sooner if necessary.  I spent 15 minutes counseling the patient face to face. The total time spent in the appointment was 20 minutes.  Oneal Deputy, am acting as scribe for Truitt Merle, MD.   I have reviewed the above documentation for accuracy and completeness, and I agree with the above.    Truitt Merle  06/16/2018

## 2018-06-16 ENCOUNTER — Inpatient Hospital Stay: Payer: Medicare Other

## 2018-06-16 ENCOUNTER — Telehealth: Payer: Self-pay | Admitting: Hematology

## 2018-06-16 ENCOUNTER — Inpatient Hospital Stay: Payer: Medicare Other | Attending: Hematology | Admitting: Hematology

## 2018-06-16 VITALS — BP 124/63 | HR 58 | Temp 98.1°F | Resp 17 | Ht 65.0 in | Wt 174.8 lb

## 2018-06-16 DIAGNOSIS — R251 Tremor, unspecified: Secondary | ICD-10-CM | POA: Diagnosis not present

## 2018-06-16 DIAGNOSIS — D509 Iron deficiency anemia, unspecified: Secondary | ICD-10-CM | POA: Diagnosis not present

## 2018-06-16 DIAGNOSIS — D72829 Elevated white blood cell count, unspecified: Secondary | ICD-10-CM | POA: Insufficient documentation

## 2018-06-16 LAB — IRON AND TIBC
IRON: 63 ug/dL (ref 42–163)
Saturation Ratios: 25 % — ABNORMAL LOW (ref 42–163)
TIBC: 255 ug/dL (ref 202–409)
UIBC: 192 ug/dL

## 2018-06-16 LAB — CBC WITH DIFFERENTIAL (CANCER CENTER ONLY)
Basophils Absolute: 0.1 10*3/uL (ref 0.0–0.1)
Basophils Relative: 0 %
EOS PCT: 1 %
Eosinophils Absolute: 0.2 10*3/uL (ref 0.0–0.5)
HCT: 37.8 % — ABNORMAL LOW (ref 38.4–49.9)
Hemoglobin: 12.3 g/dL — ABNORMAL LOW (ref 13.0–17.1)
LYMPHS ABS: 3 10*3/uL (ref 0.9–3.3)
LYMPHS PCT: 21 %
MCH: 30.3 pg (ref 27.2–33.4)
MCHC: 32.6 g/dL (ref 32.0–36.0)
MCV: 92.9 fL (ref 79.3–98.0)
Monocytes Absolute: 1 10*3/uL — ABNORMAL HIGH (ref 0.1–0.9)
Monocytes Relative: 7 %
Neutro Abs: 9.9 10*3/uL — ABNORMAL HIGH (ref 1.5–6.5)
Neutrophils Relative %: 71 %
Platelet Count: 357 10*3/uL (ref 140–400)
RBC: 4.07 MIL/uL — AB (ref 4.20–5.82)
RDW: 14.6 % (ref 11.0–14.6)
WBC Count: 14.2 10*3/uL — ABNORMAL HIGH (ref 4.0–10.3)

## 2018-06-16 LAB — RETICULOCYTES
RBC.: 4.03 MIL/uL — ABNORMAL LOW (ref 4.20–5.82)
Retic Count, Absolute: 56.4 10*3/uL (ref 34.8–93.9)
Retic Ct Pct: 1.4 % (ref 0.8–1.8)

## 2018-06-16 LAB — FERRITIN: Ferritin: 749 ng/mL — ABNORMAL HIGH (ref 24–336)

## 2018-06-16 NOTE — Telephone Encounter (Signed)
Scheduled apt per 8/23 los - gave patient AVS and calender per los.

## 2018-06-17 ENCOUNTER — Encounter: Payer: Self-pay | Admitting: Hematology

## 2018-06-20 ENCOUNTER — Telehealth: Payer: Self-pay

## 2018-06-20 DIAGNOSIS — M1990 Unspecified osteoarthritis, unspecified site: Secondary | ICD-10-CM | POA: Diagnosis not present

## 2018-06-20 DIAGNOSIS — D72829 Elevated white blood cell count, unspecified: Secondary | ICD-10-CM | POA: Diagnosis not present

## 2018-06-20 DIAGNOSIS — Z0000 Encounter for general adult medical examination without abnormal findings: Secondary | ICD-10-CM | POA: Diagnosis not present

## 2018-06-20 DIAGNOSIS — I700 Atherosclerosis of aorta: Secondary | ICD-10-CM | POA: Diagnosis not present

## 2018-06-20 DIAGNOSIS — R251 Tremor, unspecified: Secondary | ICD-10-CM | POA: Diagnosis not present

## 2018-06-20 DIAGNOSIS — N183 Chronic kidney disease, stage 3 (moderate): Secondary | ICD-10-CM | POA: Diagnosis not present

## 2018-06-20 DIAGNOSIS — D631 Anemia in chronic kidney disease: Secondary | ICD-10-CM | POA: Diagnosis not present

## 2018-06-20 DIAGNOSIS — E538 Deficiency of other specified B group vitamins: Secondary | ICD-10-CM | POA: Diagnosis not present

## 2018-06-20 DIAGNOSIS — E669 Obesity, unspecified: Secondary | ICD-10-CM | POA: Diagnosis not present

## 2018-06-20 DIAGNOSIS — N2581 Secondary hyperparathyroidism of renal origin: Secondary | ICD-10-CM | POA: Diagnosis not present

## 2018-06-20 DIAGNOSIS — E7800 Pure hypercholesterolemia, unspecified: Secondary | ICD-10-CM | POA: Diagnosis not present

## 2018-06-20 DIAGNOSIS — I129 Hypertensive chronic kidney disease with stage 1 through stage 4 chronic kidney disease, or unspecified chronic kidney disease: Secondary | ICD-10-CM | POA: Diagnosis not present

## 2018-06-20 NOTE — Telephone Encounter (Signed)
Spoke with patient's wife, per Dr. Burr Medico notified them lab results, iron level is good, continue taking current iron supplement, she verbalized an understanding.

## 2018-06-20 NOTE — Telephone Encounter (Signed)
-----   Message from Truitt Merle, MD sent at 06/18/2018 10:56 AM EDT ----- Please let pt or his wife know his lab results, iron level is good, continue current iron supplement, thanks   Truitt Merle  06/18/2018

## 2018-07-26 ENCOUNTER — Ambulatory Visit: Payer: Medicare Other | Admitting: Hematology

## 2018-07-26 ENCOUNTER — Other Ambulatory Visit: Payer: Medicare Other
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
## 2018-07-28 ENCOUNTER — Ambulatory Visit (INDEPENDENT_AMBULATORY_CARE_PROVIDER_SITE_OTHER): Payer: Medicare Other | Admitting: Nurse Practitioner

## 2018-07-28 ENCOUNTER — Encounter: Payer: Self-pay | Admitting: Nurse Practitioner

## 2018-07-28 VITALS — BP 126/68 | HR 50 | Ht 65.0 in | Wt 173.4 lb

## 2018-07-28 DIAGNOSIS — R251 Tremor, unspecified: Secondary | ICD-10-CM

## 2018-07-28 DIAGNOSIS — R259 Unspecified abnormal involuntary movements: Secondary | ICD-10-CM

## 2018-07-28 DIAGNOSIS — G250 Essential tremor: Secondary | ICD-10-CM

## 2018-07-28 DIAGNOSIS — R269 Unspecified abnormalities of gait and mobility: Secondary | ICD-10-CM

## 2018-07-28 MED ORDER — PROPRANOLOL HCL ER 120 MG PO CP24: 120.0000 mg | ORAL_CAPSULE | Freq: Every day | ORAL | 3 refills | Status: DC

## 2018-07-28 NOTE — Patient Instructions (Signed)
Continue long acting Inderal at current dose will refill Continue Topamax 100mg  twice daily Continue using rolling walker for safe ambulation Can use weighted utensils for meals  Follow up 8 months

## 2018-08-09 DIAGNOSIS — H353132 Nonexudative age-related macular degeneration, bilateral, intermediate dry stage: Secondary | ICD-10-CM | POA: Diagnosis not present

## 2018-11-02 DIAGNOSIS — Z23 Encounter for immunization: Secondary | ICD-10-CM | POA: Diagnosis not present

## 2018-11-10 DIAGNOSIS — R066 Hiccough: Secondary | ICD-10-CM | POA: Diagnosis not present

## 2018-12-12 DIAGNOSIS — N189 Chronic kidney disease, unspecified: Secondary | ICD-10-CM | POA: Diagnosis not present

## 2018-12-12 DIAGNOSIS — I129 Hypertensive chronic kidney disease with stage 1 through stage 4 chronic kidney disease, or unspecified chronic kidney disease: Secondary | ICD-10-CM | POA: Diagnosis not present

## 2018-12-12 DIAGNOSIS — N183 Chronic kidney disease, stage 3 (moderate): Secondary | ICD-10-CM | POA: Diagnosis not present

## 2018-12-12 DIAGNOSIS — E7800 Pure hypercholesterolemia, unspecified: Secondary | ICD-10-CM | POA: Diagnosis not present

## 2018-12-12 DIAGNOSIS — Z862 Personal history of diseases of the blood and blood-forming organs and certain disorders involving the immune mechanism: Secondary | ICD-10-CM | POA: Diagnosis not present

## 2018-12-12 DIAGNOSIS — N2581 Secondary hyperparathyroidism of renal origin: Secondary | ICD-10-CM | POA: Diagnosis not present

## 2018-12-13 DIAGNOSIS — R8279 Other abnormal findings on microbiological examination of urine: Secondary | ICD-10-CM | POA: Diagnosis not present

## 2018-12-15 ENCOUNTER — Inpatient Hospital Stay: Payer: Medicare Other

## 2018-12-18 DIAGNOSIS — N390 Urinary tract infection, site not specified: Secondary | ICD-10-CM | POA: Diagnosis not present

## 2018-12-18 DIAGNOSIS — Z7982 Long term (current) use of aspirin: Secondary | ICD-10-CM | POA: Diagnosis not present

## 2018-12-18 DIAGNOSIS — E7801 Familial hypercholesterolemia: Secondary | ICD-10-CM | POA: Diagnosis not present

## 2018-12-18 DIAGNOSIS — I129 Hypertensive chronic kidney disease with stage 1 through stage 4 chronic kidney disease, or unspecified chronic kidney disease: Secondary | ICD-10-CM | POA: Diagnosis not present

## 2018-12-21 DIAGNOSIS — D72825 Bandemia: Secondary | ICD-10-CM | POA: Diagnosis not present

## 2018-12-21 DIAGNOSIS — I129 Hypertensive chronic kidney disease with stage 1 through stage 4 chronic kidney disease, or unspecified chronic kidney disease: Secondary | ICD-10-CM | POA: Diagnosis not present

## 2018-12-21 DIAGNOSIS — I700 Atherosclerosis of aorta: Secondary | ICD-10-CM | POA: Diagnosis not present

## 2018-12-21 DIAGNOSIS — R05 Cough: Secondary | ICD-10-CM | POA: Diagnosis not present

## 2018-12-21 DIAGNOSIS — G20 Parkinson's disease: Secondary | ICD-10-CM | POA: Diagnosis not present

## 2018-12-21 DIAGNOSIS — R600 Localized edema: Secondary | ICD-10-CM | POA: Diagnosis not present

## 2018-12-21 DIAGNOSIS — R8271 Bacteriuria: Secondary | ICD-10-CM | POA: Diagnosis not present

## 2018-12-21 DIAGNOSIS — R32 Unspecified urinary incontinence: Secondary | ICD-10-CM | POA: Diagnosis not present

## 2018-12-21 DIAGNOSIS — R079 Chest pain, unspecified: Secondary | ICD-10-CM | POA: Diagnosis not present

## 2018-12-21 DIAGNOSIS — R21 Rash and other nonspecific skin eruption: Secondary | ICD-10-CM | POA: Diagnosis not present

## 2018-12-21 DIAGNOSIS — N401 Enlarged prostate with lower urinary tract symptoms: Secondary | ICD-10-CM | POA: Diagnosis not present

## 2018-12-21 DIAGNOSIS — I10 Essential (primary) hypertension: Secondary | ICD-10-CM | POA: Diagnosis not present

## 2019-01-02 IMAGING — CT CT ANGIO CHEST
2 of 10 series · 16 of 46 positions shown · IV contrast (ISOVUE 370)
Comparison: None.

CLINICAL DATA: Right lower chest and right upper abdomen pain for
several hours. Nausea and productive cough.

EXAM:
CT ANGIOGRAPHY CHEST
CT ABDOMEN AND PELVIS WITH CONTRAST
TECHNIQUE: Multidetector CT imaging of the chest was performed using the
standard protocol during bolus administration of intravenous
contrast. Multiplanar CT image reconstructions and MIPs were
obtained to evaluate the vascular anatomy. Multidetector CT imaging
of the abdomen and pelvis was performed using the standard protocol
during bolus administration of intravenous contrast.
CONTRAST:  100 mL Isovue 370 intravenous

[Series 5: thins for pacs · axial · 0.65mm/px · z∈[-318,-122]mm · 14 of 224 slices shown]
[im 14/224  lung]
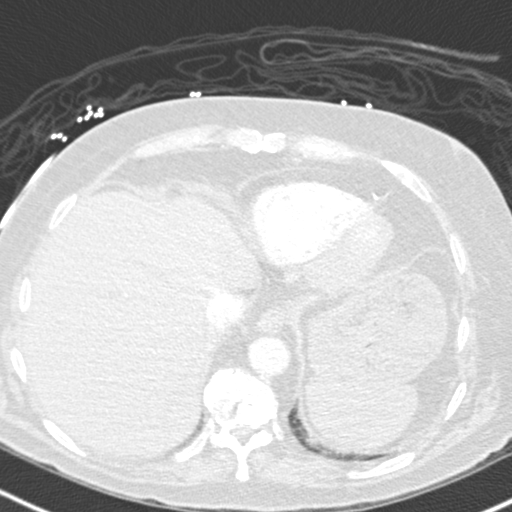
[im 28/224  soft-tissue]
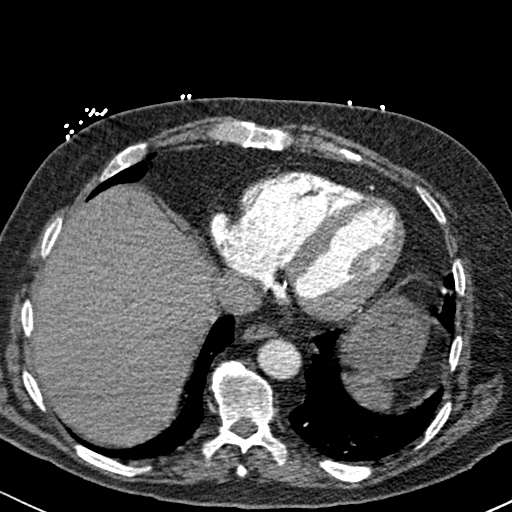
[im 42/224  lung]
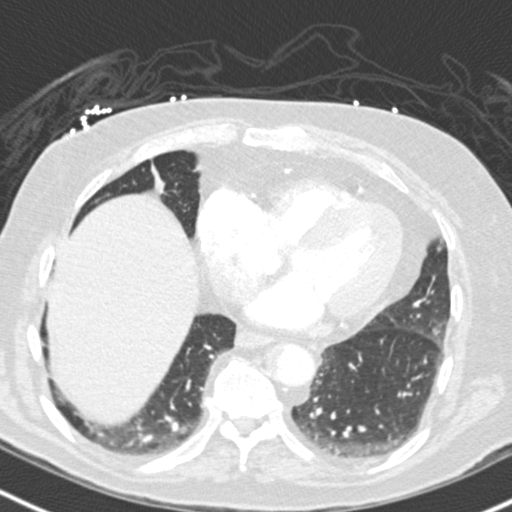
[im 56/224  soft-tissue]
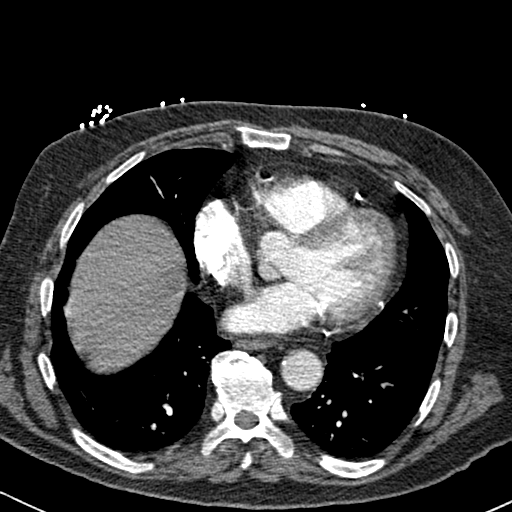
[im 70/224  lung]
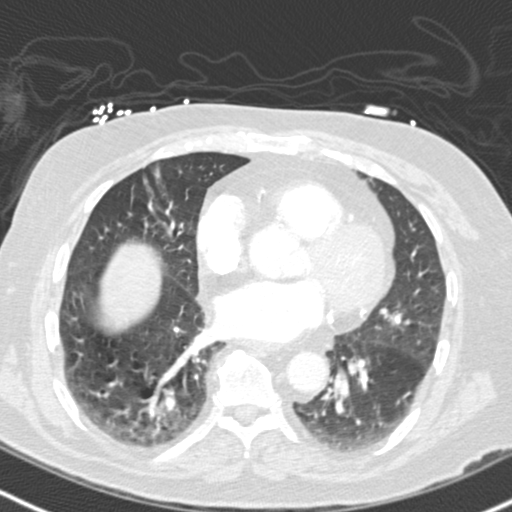
[im 84/224  soft-tissue]
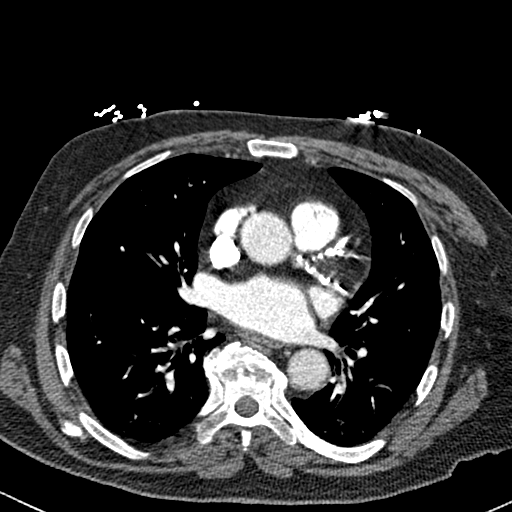
[im 98/224  lung]
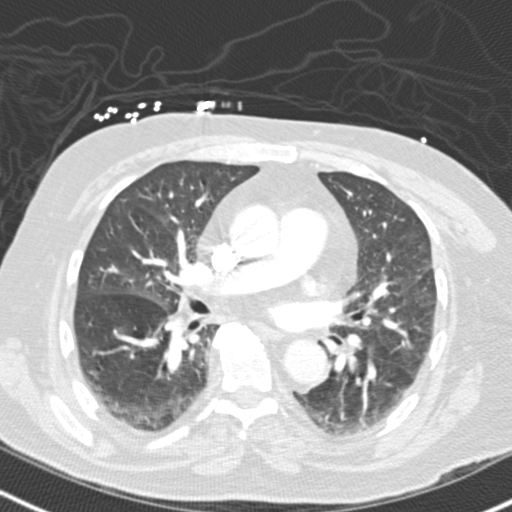
[im 126/224  soft-tissue]
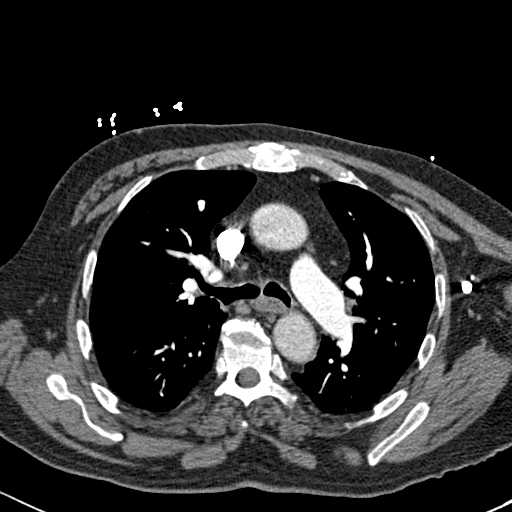
[im 140/224  lung]
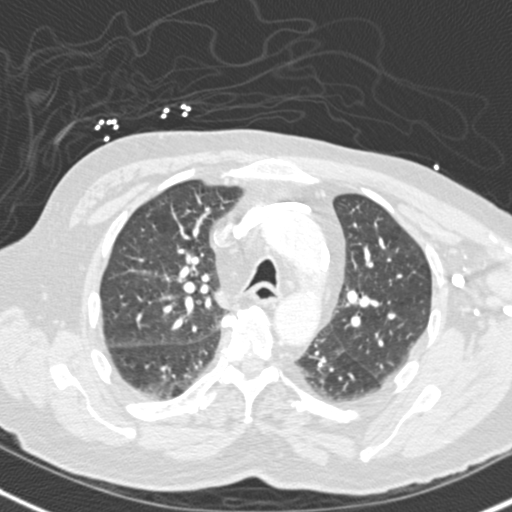
[im 154/224  soft-tissue]
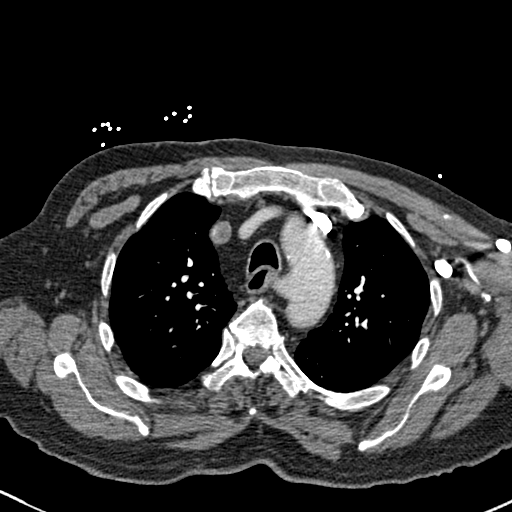
[im 168/224  lung]
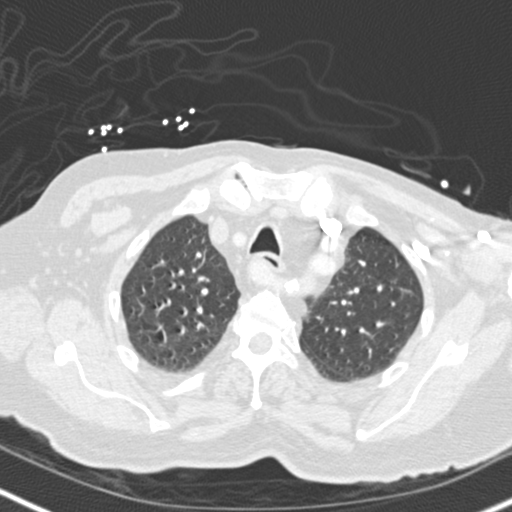
[im 182/224  soft-tissue]
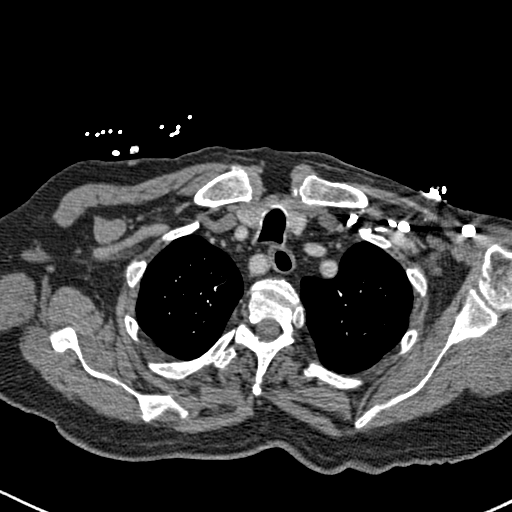
[im 196/224  lung]
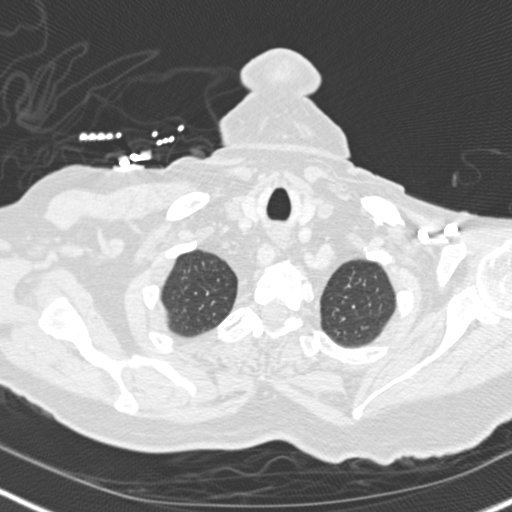
[im 210/224  soft-tissue]
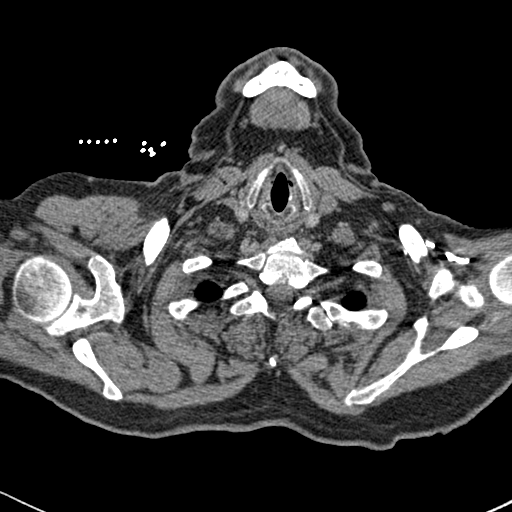

[Series 7: coronal mpr · coronal · 0.44mm/px · 2 of 121 slices shown]
[im 41/121  soft-tissue]
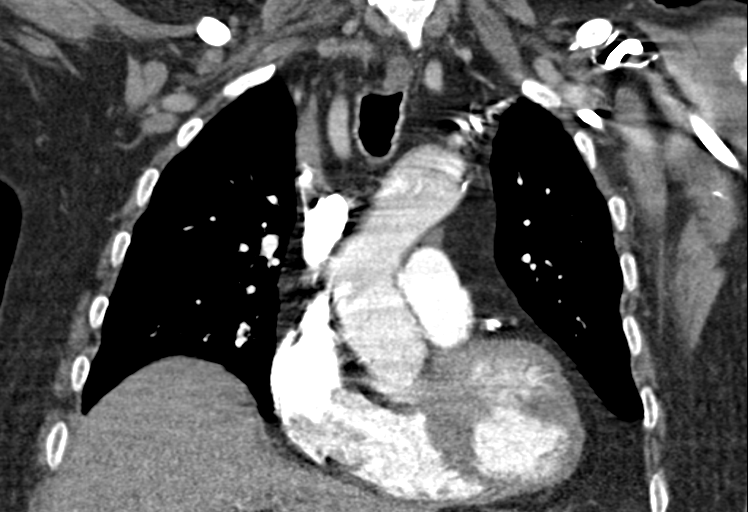
[im 81/121  soft-tissue]
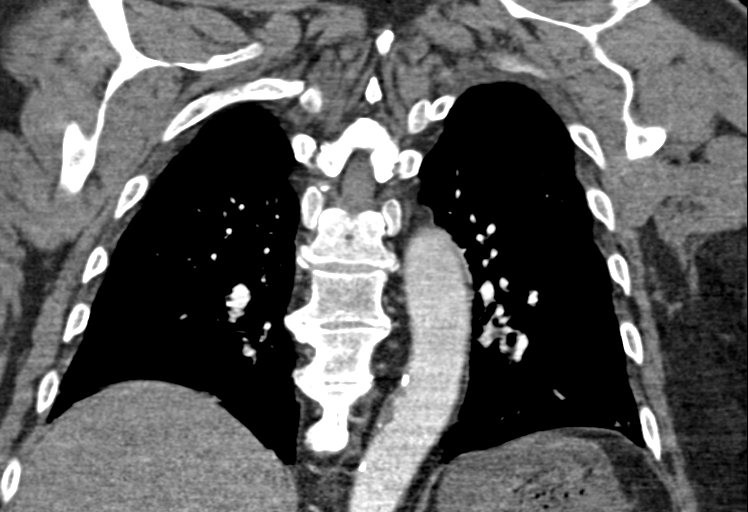

[16 of 46 positions shown; findings below may reference images not displayed]

FINDINGS: CTA CHEST FINDINGS

Cardiovascular: Satisfactory opacification of the pulmonary arteries
to the segmental level. No evidence of pulmonary embolism. Normal
heart size. No pericardial effusion. Extensive atherosclerotic
calcifications of the coronary arteries. Moderate atherosclerotic
calcification of the thoracic aorta. Incidental aberrant right
subclavian artery, a normal variant.

Mediastinum/Nodes: No enlarged mediastinal, hilar, or axillary lymph
nodes. Thyroid gland, trachea, and esophagus demonstrate no
significant findings.

Lungs/Pleura: Mild linear atelectatic appearing opacity in the right
middle lobe base. No confluent airspace consolidation. Airways are
patent. No pleural effusions. Incidental calcified granuloma of the
right lower lobe superior segment.

Musculoskeletal: No chest wall abnormality. No acute or significant
osseous findings.

Review of the MIP images confirms the above findings.

CT ABDOMEN and PELVIS FINDINGS

Hepatobiliary: No focal liver abnormality is seen. No gallstones,
gallbladder wall thickening, or biliary dilatation.

Pancreas: Unremarkable. No pancreatic ductal dilatation or
surrounding inflammatory changes.

Spleen: Normal in size without focal abnormality.

Adrenals/Urinary Tract: There are several low-attenuation lesions of
the kidneys, most likely benign cysts. No suspicious renal
parenchymal lesion. Collecting systems and ureters are unremarkable.
Urinary bladder is unremarkable except for a moderate impression on
its base from the markedly enlarged prostate.

Stomach/Bowel: Extensive colonic diverticulosis. Stomach and small
bowel are unremarkable. No bowel obstruction. No focal inflammation
of bowel. Appendix is normal.

Vascular/Lymphatic: The abdominal aorta is normal in caliber with
moderate atherosclerotic calcification. No pathologic adenopathy in
the abdomen or pelvis.

Reproductive: Marked prostatic enlargement.

Other: Mild hazy attenuation of the mesentery, particularly in the
pericolic gutters and this is mildly more prominent on the right.
This is more diffuse than would typically be seen with epiploic
appendagitis. A degree of mesenteritis may be consideration. No
focal inflammation. No ascites. No extraluminal gas.

Small fat containing inguinal hernias bilaterally.

Musculoskeletal: No acute or significant osseous findings.

Review of the MIP images confirms the above findings.
IMPRESSION: 1. Negative for acute pulmonary embolism.
2. Extensive atherosclerosis of the aorta and coronary arteries.
3. Colonic diverticulosis without evidence of diverticulitis.
4. Mild hazy attenuation in the mesentery without focal lesion. This
is slightly more prominent in the right pericolic gutter. A
component of mesenteritis might be present. Epiploic appendagitis is
less likely. Appendix is normal. No bowel obstruction or focal
inflammation of bowel.

## 2019-01-04 DIAGNOSIS — G250 Essential tremor: Secondary | ICD-10-CM | POA: Diagnosis not present

## 2019-01-04 DIAGNOSIS — F339 Major depressive disorder, recurrent, unspecified: Secondary | ICD-10-CM | POA: Diagnosis not present

## 2019-01-04 DIAGNOSIS — R600 Localized edema: Secondary | ICD-10-CM | POA: Diagnosis not present

## 2019-01-04 DIAGNOSIS — R05 Cough: Secondary | ICD-10-CM | POA: Diagnosis not present

## 2019-01-04 DIAGNOSIS — I129 Hypertensive chronic kidney disease with stage 1 through stage 4 chronic kidney disease, or unspecified chronic kidney disease: Secondary | ICD-10-CM | POA: Diagnosis not present

## 2019-04-03 ENCOUNTER — Ambulatory Visit: Payer: Medicare Other | Admitting: Neurology

## 2019-04-05 ENCOUNTER — Encounter: Payer: Self-pay | Admitting: Neurology

## 2019-04-05 ENCOUNTER — Ambulatory Visit (INDEPENDENT_AMBULATORY_CARE_PROVIDER_SITE_OTHER): Payer: Medicare Other | Admitting: Neurology

## 2019-04-05 ENCOUNTER — Other Ambulatory Visit: Payer: Self-pay

## 2019-04-05 VITALS — BP 108/60 | HR 68 | Temp 98.6°F

## 2019-04-05 DIAGNOSIS — G250 Essential tremor: Secondary | ICD-10-CM

## 2019-04-05 DIAGNOSIS — M7989 Other specified soft tissue disorders: Secondary | ICD-10-CM

## 2019-04-05 DIAGNOSIS — R259 Unspecified abnormal involuntary movements: Secondary | ICD-10-CM | POA: Diagnosis not present

## 2019-04-05 DIAGNOSIS — R269 Unspecified abnormalities of gait and mobility: Secondary | ICD-10-CM | POA: Diagnosis not present
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
## 2019-04-05 NOTE — Patient Instructions (Signed)
I think it is important that you see your primary care physician due to cough reported and your swelling in the legs is worse

## 2019-04-12 DIAGNOSIS — G250 Essential tremor: Secondary | ICD-10-CM | POA: Diagnosis not present

## 2019-04-12 DIAGNOSIS — F411 Generalized anxiety disorder: Secondary | ICD-10-CM | POA: Diagnosis not present

## 2019-04-12 DIAGNOSIS — Z20828 Contact with and (suspected) exposure to other viral communicable diseases: Secondary | ICD-10-CM | POA: Diagnosis not present

## 2019-04-12 DIAGNOSIS — R2689 Other abnormalities of gait and mobility: Secondary | ICD-10-CM | POA: Diagnosis not present

## 2019-04-12 DIAGNOSIS — R05 Cough: Secondary | ICD-10-CM | POA: Diagnosis not present

## 2019-06-14 NOTE — Progress Notes (Signed)
[facility]   [phone] [phone]   Clinic Follow up Note   Patient Care Team: [name], MD as PCP - General (Internal Medicine)  Date of Service:  06/15/2019  CHIEF COMPLAINT: F/u for Anemia and leukocytosis    CURRENT THERAPY:  Oral Ferrous sulfate 325 mg once every other day  INTERVAL HISTORY:  Andres Lopez is here for a follow up of anemia and Leukocytosis. He was last seen by me 1 year ago. He presents to the clinic alone. He notes he feels stable with no new changes. He notes he is not able to do much at home. He can get dressed and ambulate with a walker at home. Plans to get a recliner that lifts so it can help him get up. He notes getting up from lower chairs is the only issue he has. He denies being in pain. He notes he has LE edema and takes lasix. He notes he is hard of hearing and normally his wife helps with communication.    REVIEW OF SYSTEMS:   Constitutional: Denies fevers, chills or abnormal weight loss Eyes: Denies blurriness of vision Ears, nose, mouth, throat, and face: Denies mucositis or sore throat (+) hard of hearing Respiratory: Denies cough, dyspnea or wheezes Cardiovascular: Denies palpitation, chest discomfort (+) B/l lower extremity swelling Gastrointestinal:  Denies nausea, heartburn or change in bowel habits Skin: Denies abnormal skin rashes Lymphatics: Denies new lymphadenopathy or easy bruising Neurological:Denies numbness, tingling or new weaknesses (+) tremors  Behavioral/Psych: Mood is stable, no new changes  All other systems were reviewed with the patient and are negative.  MEDICAL HISTORY:  Past Medical History:  Diagnosis Date  . Essential tremor 01/15/2014  . Hypertension     SURGICAL HISTORY: Past Surgical History:  Procedure Laterality Date  . TUMOR REMOVAL     neck    I have reviewed the social history and family history with the patient and they are unchanged from previous note.  ALLERGIES:  is allergic to tramadol.  MEDICATIONS:  Current Outpatient Medications  Medication Sig Dispense Refill  . ascorbic acid (VITAMIN C) 500 MG tablet Take 500 mg by mouth daily.    Marland Kitchen aspirin EC 81 MG tablet Take 81 mg by mouth at bedtime.     Marland Kitchen atorvastatin (LIPITOR) 80 MG tablet Take 40 mg by mouth every other day.     . ferrous sulfate 325 (65 FE) MG tablet Take 325 mg by mouth every other day.     . furosemide (LASIX) 40 MG tablet Take 40 mg by mouth 2 (two) times daily.    Marland Kitchen losartan (COZAAR) 100 MG tablet Take 50 mg by mouth.     . propranolol ER (INDERAL LA) 120 MG 24 hr capsule Take 1 capsule (120 mg total) by mouth daily. 90 capsule 3  . sertraline (ZOLOFT) 50 MG tablet Take 50 mg by mouth daily.    Marland Kitchen topiramate (TOPAMAX) 50 MG tablet Take 2 tablets (100 mg total) by mouth 2 (two) times daily. 360 tablet 3   No current facility-administered medications for this visit.     PHYSICAL EXAMINATION: ECOG PERFORMANCE STATUS: 2 - Symptomatic, <50% confined to bed  Vitals:   06/15/19 1355  BP: (!) 127/95  Pulse: (!) 57  Resp: 17  Temp: 98.9 F (37.2 C)  SpO2: 99%   Filed Weights   06/15/19 1355  Weight: 175 lb 11.2 oz (79.7 kg)    GENERAL:alert, no distress and comfortable (+) hard  of hearing  SKIN: skin color, texture, turgor are normal, no rashes or significant lesions EYES: normal, Conjunctiva are pink and non-injected, sclera clear NECK: supple, thyroid normal size, non-tender, without nodularity LYMPH:  no palpable lymphadenopathy in the cervical, axillary  LUNGS: clear to auscultation and percussion with normal breathing effort HEART: regular rate & rhythm and no murmurs (+) B/l lower extremity edema ABDOMEN:abdomen soft, non-tender and normal bowel sounds Musculoskeletal:no cyanosis of digits and no clubbing  NEURO: alert & oriented x 3 with fluent speech, no focal motor/sensory deficits (+) tremors  LABORATORY DATA:  I have reviewed the data as listed  CBC Latest Ref Rng & Units 06/15/2019 06/16/2018 03/17/2018  WBC 4.0 - 10.5 K/uL 18.4(H) 14.2(H) 21.6(H)  Hemoglobin 13.0 - 17.0 g/dL 11.9(L) 12.3(L) 12.3(L)  Hematocrit 39.0 - 52.0 % 37.1(L) 37.8(L) 38.3(L)  Platelets 150 - 400 K/uL 348 357 315     CMP Latest Ref Rng & Units 02/21/2017 08/11/2015 02/10/2015  Glucose 65 - 99 mg/dL 120(H) 106 111  BUN 6 - 20 mg/dL 17 15.1 15.0  Creatinine 0.61 - 1.24 mg/dL 1.25(H) 1.2 1.1  Sodium 135 - 145 mmol/L 142 144 143  Potassium 3.5 - 5.1 mmol/L 3.2(L) 3.6 3.4(L)  Chloride 101 - 111 mmol/L 108 - -  CO2 22 - 32 mmol/L 23 20(L) 21(L)  Calcium 8.9 - 10.3 mg/dL 9.1 9.2 9.0  Total Protein 6.5 - 8.1 g/dL 7.2 7.0 6.9  Total Bilirubin 0.3 - 1.2 mg/dL 0.5 0.34 0.25  Alkaline Phos 38 - 126 U/L 112 110 111  AST 15 - 41 U/L 65(H) 13 10  ALT 17 - 63 U/L 41 13 12      RADIOGRAPHIC STUDIES: I have personally reviewed the radiological images as listed and agreed with the findings in the report. No results found.   ASSESSMENT & PLAN:  Andres Lopez is a 83 y.o. male with   1. Chronic Anemia NOS, iron deficiency and anemia of chronic disease  -His iron study in 2014 showed normal ferritin at 210, low serum iron and saturation. -His anemia has been gradually improved since he started iron supplement.  -His clinical presentation is more consistent with iron deficient anemia -GI work-up in 2014 was unremarkable making a GI bleeding source less likely. Still he does have a documented history of diverticulosis seen on colonoscopy done on 12/19/2006 due to hemoccult positivity.  -His iron study showed normal serum iron and transferrin saturation, his ferritin level has been high since 2017, I decreased his oral iron  -His Jak2 test was negative -He has been on Oral ferrous sulfate every other day.  -Labs reviewed today, CBC normal except WBC 18.4, Hg 11.9, ANC 13.9. Retic count normal. Iron panel still pending. -Continue oral iron, will determine iron  supplement frequency based in his iron study results  -F/u in 1 year. Will f/u with PCP in interim  2. Leukocytosis, chronic -This has been chronic since 2008. Likely reactive. -due to his worsening leukocytosis, with predominant neutrophils, I previously ordered BCR/ABL FISH, which came back negative -His genetic mutation on Jak2,MPL, IDH, etc. (MPN panel) was negative.I think his leukocytosis is likely reactive, much less likely MPN. I do not think he needs a bone marrow biopsy for now. -WBC at 18.4, ANC at 13.9 today (06/15/19)  3. Tremors -He is currently followed by a neurologist for this who has placed him on Topomax. He reports that this does help with his tremors some. His neurologist will continue to monitor this  for ongoing management.  -Ambulates with walker -He is managing moderately well overall. I encouraged him to be active with light exercise, he is not very active at home. He does ambulate with walker at home and can go to bathroom himself.    PLAN: -Lab and f/u in 12 months     No problem-specific Assessment & Plan notes found for this encounter.   No orders of the defined types were placed in this encounter.  All questions were answered. The patient knows to call the clinic with any problems, questions or concerns. No barriers to learning was detected. I spent 10 minutes counseling the patient face to face. The total time spent in the appointment was 15 minutes and more than 50% was on counseling and review of test results     Truitt Merle, MD 06/15/2019   I, Joslyn Devon, am acting as scribe for Truitt Merle, MD.   I have reviewed the above documentation for accuracy and completeness, and I agree with the above.

## 2019-06-15 ENCOUNTER — Inpatient Hospital Stay: Payer: Medicare Other

## 2019-06-15 ENCOUNTER — Inpatient Hospital Stay: Payer: Medicare Other | Attending: Hematology | Admitting: Hematology

## 2019-06-15 ENCOUNTER — Encounter: Payer: Self-pay | Admitting: Hematology

## 2019-06-15 ENCOUNTER — Other Ambulatory Visit: Payer: Self-pay

## 2019-06-15 VITALS — BP 127/95 | HR 57 | Temp 98.9°F | Resp 17 | Wt 175.7 lb

## 2019-06-15 DIAGNOSIS — Z79899 Other long term (current) drug therapy: Secondary | ICD-10-CM | POA: Diagnosis not present

## 2019-06-15 DIAGNOSIS — D649 Anemia, unspecified: Secondary | ICD-10-CM | POA: Diagnosis not present

## 2019-06-15 DIAGNOSIS — D72829 Elevated white blood cell count, unspecified: Secondary | ICD-10-CM | POA: Insufficient documentation

## 2019-06-15 DIAGNOSIS — D509 Iron deficiency anemia, unspecified: Secondary | ICD-10-CM | POA: Diagnosis not present

## 2019-06-15 DIAGNOSIS — G250 Essential tremor: Secondary | ICD-10-CM | POA: Insufficient documentation

## 2019-06-15 DIAGNOSIS — Z7982 Long term (current) use of aspirin: Secondary | ICD-10-CM | POA: Insufficient documentation

## 2019-06-15 DIAGNOSIS — I10 Essential (primary) hypertension: Secondary | ICD-10-CM | POA: Insufficient documentation

## 2019-06-15 LAB — CBC WITH DIFFERENTIAL (CANCER CENTER ONLY)
Abs Immature Granulocytes: 0.28 10*3/uL — ABNORMAL HIGH (ref 0.00–0.07)
Basophils Absolute: 0.1 10*3/uL (ref 0.0–0.1)
Basophils Relative: 0 %
Eosinophils Absolute: 0.2 10*3/uL (ref 0.0–0.5)
Eosinophils Relative: 1 %
HCT: 37.1 % — ABNORMAL LOW (ref 39.0–52.0)
Hemoglobin: 11.9 g/dL — ABNORMAL LOW (ref 13.0–17.0)
Immature Granulocytes: 2 %
Lymphocytes Relative: 15 %
Lymphs Abs: 2.7 10*3/uL (ref 0.7–4.0)
MCH: 30.9 pg (ref 26.0–34.0)
MCHC: 32.1 g/dL (ref 30.0–36.0)
MCV: 96.4 fL (ref 80.0–100.0)
Monocytes Absolute: 1.3 10*3/uL — ABNORMAL HIGH (ref 0.1–1.0)
Monocytes Relative: 7 %
Neutro Abs: 13.9 10*3/uL — ABNORMAL HIGH (ref 1.7–7.7)
Neutrophils Relative %: 75 %
Platelet Count: 348 10*3/uL (ref 150–400)
RBC: 3.85 MIL/uL — ABNORMAL LOW (ref 4.22–5.81)
RDW: 14.2 % (ref 11.5–15.5)
WBC Count: 18.4 10*3/uL — ABNORMAL HIGH (ref 4.0–10.5)
nRBC: 0 % (ref 0.0–0.2)

## 2019-06-15 LAB — RETICULOCYTES
Immature Retic Fract: 14.8 % (ref 2.3–15.9)
RBC.: 3.85 MIL/uL — ABNORMAL LOW (ref 4.22–5.81)
Retic Count, Absolute: 45.4 10*3/uL (ref 19.0–186.0)
Retic Ct Pct: 1.2 % (ref 0.4–3.1)

## 2019-06-18 ENCOUNTER — Telehealth: Payer: Self-pay | Admitting: Hematology

## 2019-06-18 LAB — IRON AND TIBC
Iron: 22 ug/dL — ABNORMAL LOW (ref 42–163)
Saturation Ratios: 10 % — ABNORMAL LOW (ref 20–55)
TIBC: 216 ug/dL (ref 202–409)
UIBC: 194 ug/dL (ref 117–376)

## 2019-06-18 LAB — FERRITIN: Ferritin: 793 ng/mL — ABNORMAL HIGH (ref 24–336)

## 2019-06-18 NOTE — Telephone Encounter (Signed)
Scheduled appt per 8/21 los.  Printed and mailed appt calendar.

## 2019-06-19 DIAGNOSIS — N2581 Secondary hyperparathyroidism of renal origin: Secondary | ICD-10-CM | POA: Diagnosis not present

## 2019-06-19 DIAGNOSIS — I129 Hypertensive chronic kidney disease with stage 1 through stage 4 chronic kidney disease, or unspecified chronic kidney disease: Secondary | ICD-10-CM | POA: Diagnosis not present

## 2019-06-19 DIAGNOSIS — Z862 Personal history of diseases of the blood and blood-forming organs and certain disorders involving the immune mechanism: Secondary | ICD-10-CM | POA: Diagnosis not present

## 2019-06-19 DIAGNOSIS — R251 Tremor, unspecified: Secondary | ICD-10-CM | POA: Diagnosis not present

## 2019-06-19 DIAGNOSIS — N183 Chronic kidney disease, stage 3 (moderate): Secondary | ICD-10-CM | POA: Diagnosis not present

## 2019-06-19 DIAGNOSIS — R609 Edema, unspecified: Secondary | ICD-10-CM | POA: Diagnosis not present

## 2019-07-04 ENCOUNTER — Other Ambulatory Visit: Payer: Self-pay | Admitting: Neurology

## 2019-07-13 DIAGNOSIS — E876 Hypokalemia: Secondary | ICD-10-CM | POA: Diagnosis not present

## 2019-08-23 ENCOUNTER — Other Ambulatory Visit: Payer: Self-pay | Admitting: Nephrology

## 2019-08-23 DIAGNOSIS — N183 Chronic kidney disease, stage 3 unspecified: Secondary | ICD-10-CM

## 2019-09-03 ENCOUNTER — Telehealth: Payer: Self-pay | Admitting: Neurology

## 2019-09-03 DIAGNOSIS — R251 Tremor, unspecified: Secondary | ICD-10-CM

## 2019-09-03 DIAGNOSIS — G250 Essential tremor: Secondary | ICD-10-CM

## 2019-09-03 DIAGNOSIS — R269 Unspecified abnormalities of gait and mobility: Secondary | ICD-10-CM

## 2019-09-03 MED ORDER — PROPRANOLOL HCL ER 120 MG PO CP24: 120.0000 mg | ORAL_CAPSULE | Freq: Every day | ORAL | 3 refills | Status: DC

## 2019-09-03 NOTE — Telephone Encounter (Signed)
I called pt. He needs a refill on propranolol sent to [facility] in [place]. Refill completed. Pt verbalized understanding and appreciation.

## 2019-09-03 NOTE — Telephone Encounter (Signed)
Pt called stating he is needing a refill on his medication but is not able to read the information off the bottle he has. Pt states that all he knows is that it was a capsule. Please advise.

## 2019-09-07 ENCOUNTER — Ambulatory Visit
Admission: RE | Admit: 2019-09-07 | Discharge: 2019-09-07 | Disposition: A | Discharge status: Home / Self Care | Payer: Medicare Other | Source: Ambulatory Visit | Attending: Nephrology | Admitting: Nephrology

## 2019-09-07 DIAGNOSIS — N183 Chronic kidney disease, stage 3 unspecified: Secondary | ICD-10-CM

## 2019-09-07 DIAGNOSIS — N281 Cyst of kidney, acquired: Secondary | ICD-10-CM | POA: Diagnosis not present

## 2019-09-15 DIAGNOSIS — W19XXXA Unspecified fall, initial encounter: Secondary | ICD-10-CM | POA: Diagnosis not present

## 2019-09-15 DIAGNOSIS — M4316 Spondylolisthesis, lumbar region: Secondary | ICD-10-CM | POA: Diagnosis not present

## 2019-09-15 DIAGNOSIS — M5126 Other intervertebral disc displacement, lumbar region: Secondary | ICD-10-CM | POA: Diagnosis not present

## 2019-09-15 DIAGNOSIS — Z043 Encounter for examination and observation following other accident: Secondary | ICD-10-CM | POA: Diagnosis not present

## 2019-09-15 DIAGNOSIS — Z23 Encounter for immunization: Secondary | ICD-10-CM | POA: Diagnosis not present

## 2019-09-15 DIAGNOSIS — R109 Unspecified abdominal pain: Secondary | ICD-10-CM | POA: Diagnosis not present

## 2019-09-15 DIAGNOSIS — M47816 Spondylosis without myelopathy or radiculopathy, lumbar region: Secondary | ICD-10-CM | POA: Diagnosis not present

## 2019-09-15 DIAGNOSIS — S51801A Unspecified open wound of right forearm, initial encounter: Secondary | ICD-10-CM | POA: Diagnosis not present

## 2019-09-15 DIAGNOSIS — S301XXA Contusion of abdominal wall, initial encounter: Secondary | ICD-10-CM | POA: Diagnosis not present

## 2019-10-09 ENCOUNTER — Ambulatory Visit (INDEPENDENT_AMBULATORY_CARE_PROVIDER_SITE_OTHER): Payer: Medicare Other | Admitting: Adult Health

## 2019-10-09 ENCOUNTER — Other Ambulatory Visit: Payer: Self-pay

## 2019-10-09 ENCOUNTER — Encounter: Payer: Self-pay | Admitting: Adult Health

## 2019-10-09 VITALS — BP 140/70 | HR 57 | Temp 97.0°F | Ht 65.0 in | Wt 178.6 lb

## 2019-10-09 DIAGNOSIS — R269 Unspecified abnormalities of gait and mobility: Secondary | ICD-10-CM | POA: Diagnosis not present

## 2019-10-09 DIAGNOSIS — R259 Unspecified abnormal involuntary movements: Secondary | ICD-10-CM | POA: Diagnosis not present

## 2019-10-09 NOTE — Progress Notes (Addendum)
PATIENT: Andres Lopez DOB: Mar 15, 1936  REASON FOR VISIT: follow up HISTORY FROM: patient  HISTORY OF PRESENT ILLNESS: Today 10/09/19:  Andres Lopez is an 83 year old male with a history of tremor.  He returns today for follow-up.  He feels that his tremor is worse.  It affects the right hand more than the left.  He states he has a tremor at rest in the right hand but the tremor is worse when he is feeding himself or using a pen.  He states that he has difficulty walking.  States that he has a hard time getting his feet to start.  He also states that his feet feel heavy.  He denies any trouble chewing or swallowing food.  Reports that he sleeps okay.  He continues on propranolol and Topamax for the tremor.  He has been on Mysoline in the past but was unable to tolerate this.  He returns today for an evaluation.  HISTORY (copied from Dr. Guadelupe Sabin note) 04/05/2019: He reports that his legs do not move very well, his swelling is worse, he was put on a water pill but started having a cough, he denies a fever, he denies a productive cough.  Tremor is fairly stable.  He has not seen his primary care physician recently.   REVIEW OF SYSTEMS: Out of a complete 14 system review of symptoms, the patient complains only of the following symptoms, and all other reviewed systems are negative.  See HPI  ALLERGIES: Allergies  Allergen Reactions  . Tramadol Other (See Comments)    "makes him crazy"    HOME MEDICATIONS: Outpatient Medications Prior to Visit  Medication Sig Dispense Refill  . ascorbic acid (VITAMIN C) 500 MG tablet Take 500 mg by mouth daily.    Marland Kitchen aspirin EC 81 MG tablet Take 81 mg by mouth at bedtime.     Marland Kitchen atorvastatin (LIPITOR) 80 MG tablet Take 40 mg by mouth every other day.     . ferrous sulfate 325 (65 FE) MG tablet Take 325 mg by mouth every other day.     . furosemide (LASIX) 40 MG tablet Take 40 mg by mouth 2 (two) times daily.    Marland Kitchen losartan (COZAAR) 100 MG tablet Take 50  mg by mouth.     . propranolol ER (INDERAL LA) 120 MG 24 hr capsule Take 1 capsule (120 mg total) by mouth daily. 90 capsule 3  . sertraline (ZOLOFT) 50 MG tablet Take 50 mg by mouth daily.    Marland Kitchen topiramate (TOPAMAX) 50 MG tablet TAKE 2 TABLETS BY MOUTH TWICE DAILY 360 tablet 3   No facility-administered medications prior to visit.    PAST MEDICAL HISTORY: Past Medical History:  Diagnosis Date  . Essential tremor 01/15/2014  . Hypertension     PAST SURGICAL HISTORY: Past Surgical History:  Procedure Laterality Date  . TUMOR REMOVAL     neck    FAMILY HISTORY: Family History  Problem Relation Age of Onset  . Parkinsonism Mother     SOCIAL HISTORY: Social History   Socioeconomic History  . Marital status: Married    Spouse name: Everlene Farrier  . Number of children: 3  . Years of education: 51  . Highest education level: Not on file  Occupational History    Comment: retired  Tobacco Use  . Smoking status: Former Research scientist (life sciences)  . Smokeless tobacco: Never Used  Substance and Sexual Activity  . Alcohol use: No  . Drug use: No  . Sexual activity:  Never  Other Topics Concern  . Not on file  Social History Narrative   Patient is right handed,resides with wife   Social Determinants of Health   Financial Resource Strain:   . Difficulty of Paying Living Expenses: Not on file  Food Insecurity:   . Worried About Charity fundraiser in the Last Year: Not on file  . Ran Out of Food in the Last Year: Not on file  Transportation Needs:   . Lack of Transportation (Medical): Not on file  . Lack of Transportation (Non-Medical): Not on file  Physical Activity:   . Days of Exercise per Week: Not on file  . Minutes of Exercise per Session: Not on file  Stress:   . Feeling of Stress : Not on file  Social Connections:   . Frequency of Communication with Friends and Family: Not on file  . Frequency of Social Gatherings with Friends and Family: Not on file  . Attends Religious Services: Not  on file  . Active Member of Clubs or Organizations: Not on file  . Attends Archivist Meetings: Not on file  . Marital Status: Not on file  Intimate Partner Violence:   . Fear of Current or Ex-Partner: Not on file  . Emotionally Abused: Not on file  . Physically Abused: Not on file  . Sexually Abused: Not on file      PHYSICAL EXAM  Vitals:   10/09/19 1447  BP: 140/70  Pulse: (!) 57  Temp: (!) 97 F (36.1 C)  Weight: 178 lb 9.6 oz (81 kg)  Height: 5\' 5"  (1.651 m)   Body mass index is 29.72 kg/m.  Generalized: Well developed, in no acute distress   Neurological examination  Mentation: Alert oriented to time, place, history taking. Follows all commands speech and language fluent Cranial nerve II-XII: Pupils were equal round reactive to light. Extraocular movements were full, visual field were full on confrontational test. Head turning and shoulder shrug  were normal and symmetric. Motor: The motor testing reveals 5 over 5 strength of all 4 extremities.  Mild cogwheeling in the right wrist.  Moderate impairment of finger and toe taps bilaterally.  Sensory: Sensory testing is intact to soft touch on all 4 extremities. No evidence of extinction is noted.  Coordination: Cerebellar testing reveals good finger-nose-finger and heel-to-shin bilaterally.  Tremor noted in the upper extremities with finger-nose-finger Gait and station: Patient requires assistance with standing.  Patient has several freezing episodes when ambulating.  Mild shuffling gait.  Uses Rollator when ambulating.  DIAGNOSTIC DATA (LABS, IMAGING, TESTING) - I reviewed patient records, labs, notes, testing and imaging myself where available.  Lab Results  Component Value Date   WBC 18.4 (H) 06/15/2019   HGB 11.9 (L) 06/15/2019   HCT 37.1 (L) 06/15/2019   MCV 96.4 06/15/2019   PLT 348 06/15/2019      Component Value Date/Time   NA 142 02/21/2017 0316   NA 144 08/11/2015 1300   K 3.2 (L) 02/21/2017  0316   K 3.6 08/11/2015 1300   CL 108 02/21/2017 0316   CO2 23 02/21/2017 0316   CO2 20 (L) 08/11/2015 1300   GLUCOSE 120 (H) 02/21/2017 0316   GLUCOSE 106 08/11/2015 1300   BUN 17 02/21/2017 0316   BUN 15.1 08/11/2015 1300   CREATININE 1.25 (H) 02/21/2017 0316   CREATININE 1.2 08/11/2015 1300   CALCIUM 9.1 02/21/2017 0316   CALCIUM 9.2 08/11/2015 1300   PROT 7.2 02/21/2017 0316  PROT 7.0 08/11/2015 1300   ALBUMIN 3.6 02/21/2017 0316   ALBUMIN 3.5 08/11/2015 1300   AST 65 (H) 02/21/2017 0316   AST 13 08/11/2015 1300   ALT 41 02/21/2017 0316   ALT 13 08/11/2015 1300   ALKPHOS 112 02/21/2017 0316   ALKPHOS 110 08/11/2015 1300   BILITOT 0.5 02/21/2017 0316   BILITOT 0.34 08/11/2015 1300   GFRNONAA 53 (L) 02/21/2017 0316   GFRAA >60 02/21/2017 0316    Lab Results  Component Value Date   VITAMINB12 232 09/21/2013   Lab Results  Component Value Date   TSH 3.350 10/10/2013      ASSESSMENT AND PLAN 83 y.o. year old male  has a past medical history of Essential tremor (01/15/2014) and Hypertension. here with:  1: Mixed action and resting tremor 2.  Abnormality of gait  The patient will continue on Topamax and propranolol for now.  The patient did have several freezing episodes when ambulating.  I plan to discuss with Dr. Rexene Alberts about initiation of Sinemet.  Advised patient that I would follow-up with him after this discussion.  Patient and his wife advised that if his symptoms worsen or he develops new symptoms he should let us know.  He will follow-up in 4 months or sooner if needed.      Ward Givens, MSN, NP-C 10/09/2019, 2:41 PM [facility] [address], [place], [place] [address] [phone]  I reviewed the above note and documentation by the Nurse Practitioner and agree with the history, exam, assessment and plan as outlined above. I was available for consultation. A cautious trial of Sinemet low-dose can be considered starting with  half a pill twice daily for 1 week then half a pill 3 times a day for 1 week, then 1 pill 3 times daily thereafter. Star Age, MD, PhD [facility])

## 2019-10-09 NOTE — Patient Instructions (Signed)
Your Plan:  Continue Topamax and Propranolol  Continue to monitor symptoms  If your symptoms worsen or you develop new symptoms please let us know.   Thank you for coming to see [place] at [facility]. I hope we have been able to provide you high quality care today.  You may receive a patient satisfaction survey over the next few weeks. We would appreciate your feedback and comments so that we may continue to improve ourselves and the health of our patients.

## 2019-10-16 ENCOUNTER — Telehealth: Payer: Self-pay | Admitting: Adult Health

## 2019-10-16 NOTE — Telephone Encounter (Signed)
I consulted with Dr. Rexene Alberts. Sinemet low-dose can be considered starting with half a pill twice daily for 1 week then half a pill 3 times a day for 1 week, then 1 pill 3 times daily thereafter. If patient wants to try this then we can order.

## 2019-10-16 NOTE — Telephone Encounter (Signed)
I called and spoke to [place], son of pt.  I relayed that per MM/NP and Dr. Rexene Alberts that sinemet can be given titration as per MM/NP note.  Son will discuss and call us back.

## 2019-10-17 MED ORDER — CARBIDOPA-LEVODOPA 25-100 MG PO TABS: ORAL_TABLET | ORAL | 3 refills | Status: DC

## 2019-10-17 NOTE — Addendum Note (Signed)
Addended by: Trudie Buckler on: 10/17/2019 02:54 PM   Modules accepted: Orders

## 2019-10-17 NOTE — Telephone Encounter (Signed)
I called and spoke to pt, He was ok to try low dose sinemet as suggested.  I went over common SE (listed in webmd.).  He asked if other meds that he takes for tremor if he would continue on those or stop. Please advise.  Uses urgent pharmacy.

## 2019-10-17 NOTE — Telephone Encounter (Signed)
I called and spoke to wife.  I relayed the instructions.  They will discuss and call us back if want to proceed.

## 2019-10-17 NOTE — Telephone Encounter (Signed)
Pt has called requesting a call from [place] re: what was discussed with wife earlier this morning.

## 2019-10-17 NOTE — Telephone Encounter (Signed)
Order for Sinemet sent.  He will continue on all other medication at this time.

## 2019-12-31 DIAGNOSIS — I10 Essential (primary) hypertension: Secondary | ICD-10-CM | POA: Diagnosis not present

## 2019-12-31 DIAGNOSIS — Z7982 Long term (current) use of aspirin: Secondary | ICD-10-CM | POA: Diagnosis not present

## 2019-12-31 DIAGNOSIS — I129 Hypertensive chronic kidney disease with stage 1 through stage 4 chronic kidney disease, or unspecified chronic kidney disease: Secondary | ICD-10-CM | POA: Diagnosis not present

## 2020-01-07 DIAGNOSIS — G20 Parkinson's disease: Secondary | ICD-10-CM | POA: Diagnosis not present

## 2020-01-07 DIAGNOSIS — R600 Localized edema: Secondary | ICD-10-CM | POA: Diagnosis not present

## 2020-01-07 DIAGNOSIS — F331 Major depressive disorder, recurrent, moderate: Secondary | ICD-10-CM | POA: Diagnosis not present

## 2020-01-07 DIAGNOSIS — G250 Essential tremor: Secondary | ICD-10-CM | POA: Diagnosis not present

## 2020-01-07 DIAGNOSIS — E7800 Pure hypercholesterolemia, unspecified: Secondary | ICD-10-CM | POA: Diagnosis not present

## 2020-01-07 DIAGNOSIS — D509 Iron deficiency anemia, unspecified: Secondary | ICD-10-CM | POA: Diagnosis not present

## 2020-01-07 DIAGNOSIS — R32 Unspecified urinary incontinence: Secondary | ICD-10-CM | POA: Diagnosis not present

## 2020-01-07 DIAGNOSIS — I10 Essential (primary) hypertension: Secondary | ICD-10-CM | POA: Diagnosis not present

## 2020-01-07 DIAGNOSIS — D72829 Elevated white blood cell count, unspecified: Secondary | ICD-10-CM | POA: Diagnosis not present

## 2020-01-07 DIAGNOSIS — I129 Hypertensive chronic kidney disease with stage 1 through stage 4 chronic kidney disease, or unspecified chronic kidney disease: Secondary | ICD-10-CM | POA: Diagnosis not present

## 2020-01-07 DIAGNOSIS — Z23 Encounter for immunization: Secondary | ICD-10-CM | POA: Diagnosis not present

## 2020-01-29 DIAGNOSIS — I129 Hypertensive chronic kidney disease with stage 1 through stage 4 chronic kidney disease, or unspecified chronic kidney disease: Secondary | ICD-10-CM | POA: Diagnosis not present

## 2020-01-29 DIAGNOSIS — D649 Anemia, unspecified: Secondary | ICD-10-CM | POA: Diagnosis not present

## 2020-01-29 DIAGNOSIS — R600 Localized edema: Secondary | ICD-10-CM | POA: Diagnosis not present

## 2020-01-29 DIAGNOSIS — E7800 Pure hypercholesterolemia, unspecified: Secondary | ICD-10-CM | POA: Diagnosis not present

## 2020-02-11 ENCOUNTER — Other Ambulatory Visit: Payer: Self-pay

## 2020-02-11 ENCOUNTER — Encounter: Payer: Self-pay | Admitting: Neurology

## 2020-02-11 ENCOUNTER — Ambulatory Visit (INDEPENDENT_AMBULATORY_CARE_PROVIDER_SITE_OTHER): Payer: Medicare Other | Admitting: Neurology

## 2020-02-11 VITALS — BP 146/77 | HR 60 | Temp 97.9°F | Ht 65.0 in | Wt 185.3 lb

## 2020-02-11 DIAGNOSIS — G250 Essential tremor: Secondary | ICD-10-CM

## 2020-02-11 DIAGNOSIS — G20 Parkinson's disease: Secondary | ICD-10-CM

## 2020-02-11 DIAGNOSIS — R269 Unspecified abnormalities of gait and mobility: Secondary | ICD-10-CM

## 2020-02-11 DIAGNOSIS — R251 Tremor, unspecified: Secondary | ICD-10-CM

## 2020-02-11 MED ORDER — TOPIRAMATE 50 MG PO TABS: 100.0000 mg | ORAL_TABLET | Freq: Two times a day (BID) | ORAL | 3 refills | Status: AC

## 2020-02-11 MED ORDER — CARBIDOPA-LEVODOPA 25-100 MG PO TABS: 1.0000 | ORAL_TABLET | Freq: Three times a day (TID) | ORAL | 3 refills | Status: DC

## 2020-02-11 MED ORDER — PROPRANOLOL HCL ER 120 MG PO CP24: 120.0000 mg | ORAL_CAPSULE | Freq: Every day | ORAL | 3 refills | Status: DC
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
## 2020-02-11 NOTE — Patient Instructions (Addendum)
You appear to have a mixed tremor disorder with some features of Parkinson's disease.  We will continue with your medications for tremor, propranolol long-acting as well as topiramate and your Parkinson's medication called carbidopa-levodopa at the current doses. Please follow-up in about 6 months to see Vaughan Browner, nurse practitioner.  I would like for you to use your 2 wheeled walker rather than your 4 wheeled walker for more gait safety as you are 4 wheeled walker moves away too fast at times. Please try to hydrate better with water, 4 to 6 cups of water if possible, reduce your tea intake and soda intake as caffeine can increase tremors.

## 2020-02-26 DIAGNOSIS — R251 Tremor, unspecified: Secondary | ICD-10-CM | POA: Diagnosis not present

## 2020-02-26 DIAGNOSIS — I129 Hypertensive chronic kidney disease with stage 1 through stage 4 chronic kidney disease, or unspecified chronic kidney disease: Secondary | ICD-10-CM | POA: Diagnosis not present

## 2020-02-26 DIAGNOSIS — E559 Vitamin D deficiency, unspecified: Secondary | ICD-10-CM | POA: Diagnosis not present

## 2020-02-26 DIAGNOSIS — E876 Hypokalemia: Secondary | ICD-10-CM | POA: Diagnosis not present

## 2020-02-26 DIAGNOSIS — N189 Chronic kidney disease, unspecified: Secondary | ICD-10-CM | POA: Diagnosis not present

## 2020-02-26 DIAGNOSIS — Z862 Personal history of diseases of the blood and blood-forming organs and certain disorders involving the immune mechanism: Secondary | ICD-10-CM | POA: Diagnosis not present

## 2020-02-26 DIAGNOSIS — N183 Chronic kidney disease, stage 3 unspecified: Secondary | ICD-10-CM | POA: Diagnosis not present

## 2020-02-26 DIAGNOSIS — R609 Edema, unspecified: Secondary | ICD-10-CM | POA: Diagnosis not present

## 2020-03-04 DIAGNOSIS — H838X3 Other specified diseases of inner ear, bilateral: Secondary | ICD-10-CM | POA: Diagnosis not present

## 2020-03-04 DIAGNOSIS — H6123 Impacted cerumen, bilateral: Secondary | ICD-10-CM | POA: Diagnosis not present

## 2020-03-04 DIAGNOSIS — H903 Sensorineural hearing loss, bilateral: Secondary | ICD-10-CM | POA: Diagnosis not present

## 2020-03-11 DIAGNOSIS — R600 Localized edema: Secondary | ICD-10-CM | POA: Diagnosis not present

## 2020-05-20 DIAGNOSIS — N183 Chronic kidney disease, stage 3 unspecified: Secondary | ICD-10-CM | POA: Diagnosis not present

## 2020-05-22 DIAGNOSIS — Z23 Encounter for immunization: Secondary | ICD-10-CM | POA: Diagnosis not present

## 2020-05-27 DIAGNOSIS — R609 Edema, unspecified: Secondary | ICD-10-CM | POA: Diagnosis not present

## 2020-05-27 DIAGNOSIS — E876 Hypokalemia: Secondary | ICD-10-CM | POA: Diagnosis not present

## 2020-05-27 DIAGNOSIS — R251 Tremor, unspecified: Secondary | ICD-10-CM | POA: Diagnosis not present

## 2020-05-27 DIAGNOSIS — I129 Hypertensive chronic kidney disease with stage 1 through stage 4 chronic kidney disease, or unspecified chronic kidney disease: Secondary | ICD-10-CM | POA: Diagnosis not present

## 2020-05-27 DIAGNOSIS — N2581 Secondary hyperparathyroidism of renal origin: Secondary | ICD-10-CM | POA: Diagnosis not present

## 2020-05-27 DIAGNOSIS — E872 Acidosis: Secondary | ICD-10-CM | POA: Diagnosis not present

## 2020-05-27 DIAGNOSIS — N1831 Chronic kidney disease, stage 3a: Secondary | ICD-10-CM | POA: Diagnosis not present

## 2020-06-16 ENCOUNTER — Inpatient Hospital Stay: Payer: Medicare Other | Attending: Internal Medicine

## 2020-06-16 ENCOUNTER — Inpatient Hospital Stay: Payer: Medicare Other | Admitting: Hematology

## 2020-06-19 DIAGNOSIS — Z23 Encounter for immunization: Secondary | ICD-10-CM | POA: Diagnosis not present

## 2020-08-05 DIAGNOSIS — Z23 Encounter for immunization: Secondary | ICD-10-CM | POA: Diagnosis not present

## 2020-08-05 DIAGNOSIS — Z7982 Long term (current) use of aspirin: Secondary | ICD-10-CM | POA: Diagnosis not present

## 2020-08-05 DIAGNOSIS — R749 Abnormal serum enzyme level, unspecified: Secondary | ICD-10-CM | POA: Diagnosis not present

## 2020-08-05 DIAGNOSIS — N189 Chronic kidney disease, unspecified: Secondary | ICD-10-CM | POA: Diagnosis not present

## 2020-08-05 DIAGNOSIS — R600 Localized edema: Secondary | ICD-10-CM | POA: Diagnosis not present

## 2020-08-13 ENCOUNTER — Ambulatory Visit: Payer: Medicare Other | Admitting: Adult Health

## 2020-09-01 DIAGNOSIS — D509 Iron deficiency anemia, unspecified: Secondary | ICD-10-CM | POA: Diagnosis not present

## 2020-09-01 DIAGNOSIS — N1831 Chronic kidney disease, stage 3a: Secondary | ICD-10-CM | POA: Diagnosis not present

## 2020-09-08 DIAGNOSIS — E872 Acidosis: Secondary | ICD-10-CM | POA: Diagnosis not present

## 2020-09-08 DIAGNOSIS — I129 Hypertensive chronic kidney disease with stage 1 through stage 4 chronic kidney disease, or unspecified chronic kidney disease: Secondary | ICD-10-CM | POA: Diagnosis not present

## 2020-09-08 DIAGNOSIS — D509 Iron deficiency anemia, unspecified: Secondary | ICD-10-CM | POA: Diagnosis not present

## 2020-09-08 DIAGNOSIS — N1831 Chronic kidney disease, stage 3a: Secondary | ICD-10-CM | POA: Diagnosis not present

## 2020-09-08 DIAGNOSIS — R609 Edema, unspecified: Secondary | ICD-10-CM | POA: Diagnosis not present

## 2020-09-08 DIAGNOSIS — N2581 Secondary hyperparathyroidism of renal origin: Secondary | ICD-10-CM | POA: Diagnosis not present

## 2020-09-08 DIAGNOSIS — E876 Hypokalemia: Secondary | ICD-10-CM | POA: Diagnosis not present

## 2020-12-15 ENCOUNTER — Ambulatory Visit: Payer: Medicare Other | Admitting: Adult Health

## 2020-12-16 DIAGNOSIS — Z23 Encounter for immunization: Secondary | ICD-10-CM | POA: Diagnosis not present

## 2020-12-31 ENCOUNTER — Ambulatory Visit: Payer: Medicare Other | Admitting: Neurology

## 2021-01-06 DIAGNOSIS — Z7982 Long term (current) use of aspirin: Secondary | ICD-10-CM | POA: Diagnosis not present

## 2021-01-06 DIAGNOSIS — R748 Abnormal levels of other serum enzymes: Secondary | ICD-10-CM | POA: Diagnosis not present

## 2021-01-06 DIAGNOSIS — I10 Essential (primary) hypertension: Secondary | ICD-10-CM | POA: Diagnosis not present

## 2021-01-13 DIAGNOSIS — D72829 Elevated white blood cell count, unspecified: Secondary | ICD-10-CM | POA: Diagnosis not present

## 2021-01-13 DIAGNOSIS — R159 Full incontinence of feces: Secondary | ICD-10-CM | POA: Diagnosis not present

## 2021-01-13 DIAGNOSIS — I10 Essential (primary) hypertension: Secondary | ICD-10-CM | POA: Diagnosis not present

## 2021-01-13 DIAGNOSIS — N401 Enlarged prostate with lower urinary tract symptoms: Secondary | ICD-10-CM | POA: Diagnosis not present

## 2021-01-13 DIAGNOSIS — Z0001 Encounter for general adult medical examination with abnormal findings: Secondary | ICD-10-CM | POA: Diagnosis not present

## 2021-01-13 DIAGNOSIS — G20 Parkinson's disease: Secondary | ICD-10-CM | POA: Diagnosis not present

## 2021-01-13 DIAGNOSIS — R600 Localized edema: Secondary | ICD-10-CM | POA: Diagnosis not present

## 2021-01-13 DIAGNOSIS — F331 Major depressive disorder, recurrent, moderate: Secondary | ICD-10-CM | POA: Diagnosis not present

## 2021-01-13 DIAGNOSIS — I700 Atherosclerosis of aorta: Secondary | ICD-10-CM | POA: Diagnosis not present

## 2021-01-19 ENCOUNTER — Telehealth: Payer: Self-pay

## 2021-01-19 NOTE — Telephone Encounter (Signed)
Spoke with patient's wife Everlene Farrier  and scheduled an in-person Palliative Consult for 02/03/21 @ 9AM  COVID screening was negative. Beagle in the home. Patient lives with wife Everlene Farrier.   Consent obtained; updated Outlook/Netsmart/Team List and Epic.  Family is aware they may be receiving a call from NP the day before or day of to confirm appointment.

## 2021-01-22 DIAGNOSIS — G20 Parkinson's disease: Secondary | ICD-10-CM | POA: Diagnosis not present

## 2021-01-22 DIAGNOSIS — N182 Chronic kidney disease, stage 2 (mild): Secondary | ICD-10-CM | POA: Diagnosis not present

## 2021-01-22 DIAGNOSIS — E7800 Pure hypercholesterolemia, unspecified: Secondary | ICD-10-CM | POA: Diagnosis not present

## 2021-01-22 DIAGNOSIS — I129 Hypertensive chronic kidney disease with stage 1 through stage 4 chronic kidney disease, or unspecified chronic kidney disease: Secondary | ICD-10-CM | POA: Diagnosis not present

## 2021-02-03 ENCOUNTER — Other Ambulatory Visit: Payer: Self-pay | Admitting: Adult Health Nurse Practitioner

## 2021-02-03 ENCOUNTER — Telehealth: Payer: Self-pay | Admitting: Adult Health Nurse Practitioner

## 2021-02-03 NOTE — Telephone Encounter (Signed)
Called to confirm appointment for this morning.  Spoke with wife who stated that they have someone else coming in.  Tried to clarify palliative services.  Wife declined our services.  Notified palliative admin for discharge. Deshauna Cayson K. Olena Heckle NP

## 2021-02-12 ENCOUNTER — Ambulatory Visit: Payer: Medicare Other | Admitting: Neurology

## 2021-02-16 ENCOUNTER — Telehealth: Payer: Self-pay

## 2021-02-16 ENCOUNTER — Ambulatory Visit: Payer: Medicare Other | Admitting: Neurology

## 2021-02-16 NOTE — Telephone Encounter (Signed)
Pt no showed 02/16/21 f/u with Dr. Rexene Alberts.

## 2021-02-21 DIAGNOSIS — I129 Hypertensive chronic kidney disease with stage 1 through stage 4 chronic kidney disease, or unspecified chronic kidney disease: Secondary | ICD-10-CM | POA: Diagnosis not present

## 2021-02-21 DIAGNOSIS — G20 Parkinson's disease: Secondary | ICD-10-CM | POA: Diagnosis not present

## 2021-02-21 DIAGNOSIS — E7800 Pure hypercholesterolemia, unspecified: Secondary | ICD-10-CM | POA: Diagnosis not present

## 2021-02-21 DIAGNOSIS — N182 Chronic kidney disease, stage 2 (mild): Secondary | ICD-10-CM | POA: Diagnosis not present

## 2021-02-25 ENCOUNTER — Other Ambulatory Visit: Payer: Self-pay | Admitting: Neurology

## 2021-02-25 DIAGNOSIS — R269 Unspecified abnormalities of gait and mobility: Secondary | ICD-10-CM

## 2021-02-25 DIAGNOSIS — G250 Essential tremor: Secondary | ICD-10-CM

## 2021-02-25 DIAGNOSIS — R251 Tremor, unspecified: Secondary | ICD-10-CM

## 2021-03-09 DIAGNOSIS — R2689 Other abnormalities of gait and mobility: Secondary | ICD-10-CM | POA: Diagnosis not present

## 2021-03-09 DIAGNOSIS — G20 Parkinson's disease: Secondary | ICD-10-CM | POA: Diagnosis not present

## 2021-03-09 DIAGNOSIS — R54 Age-related physical debility: Secondary | ICD-10-CM | POA: Diagnosis not present

## 2021-03-11 DIAGNOSIS — I672 Cerebral atherosclerosis: Secondary | ICD-10-CM | POA: Diagnosis not present

## 2021-03-11 DIAGNOSIS — R053 Chronic cough: Secondary | ICD-10-CM | POA: Diagnosis not present

## 2021-03-11 DIAGNOSIS — R059 Cough, unspecified: Secondary | ICD-10-CM | POA: Diagnosis not present

## 2021-03-11 DIAGNOSIS — I708 Atherosclerosis of other arteries: Secondary | ICD-10-CM | POA: Diagnosis not present

## 2021-03-11 DIAGNOSIS — R066 Hiccough: Secondary | ICD-10-CM | POA: Diagnosis not present

## 2021-03-11 DIAGNOSIS — I6523 Occlusion and stenosis of bilateral carotid arteries: Secondary | ICD-10-CM | POA: Diagnosis not present

## 2021-03-12 DIAGNOSIS — G20 Parkinson's disease: Secondary | ICD-10-CM | POA: Diagnosis not present

## 2021-03-12 DIAGNOSIS — Q433 Congenital malformations of intestinal fixation: Secondary | ICD-10-CM | POA: Diagnosis not present

## 2021-03-12 DIAGNOSIS — R066 Hiccough: Secondary | ICD-10-CM | POA: Diagnosis not present

## 2021-03-12 DIAGNOSIS — I672 Cerebral atherosclerosis: Secondary | ICD-10-CM | POA: Diagnosis not present

## 2021-03-12 DIAGNOSIS — A0811 Acute gastroenteropathy due to Norwalk agent: Secondary | ICD-10-CM | POA: Diagnosis present

## 2021-03-12 DIAGNOSIS — I10 Essential (primary) hypertension: Secondary | ICD-10-CM | POA: Diagnosis not present

## 2021-03-12 DIAGNOSIS — N2889 Other specified disorders of kidney and ureter: Secondary | ICD-10-CM | POA: Diagnosis not present

## 2021-03-12 DIAGNOSIS — I4510 Unspecified right bundle-branch block: Secondary | ICD-10-CM | POA: Diagnosis not present

## 2021-03-12 DIAGNOSIS — I4891 Unspecified atrial fibrillation: Secondary | ICD-10-CM | POA: Diagnosis not present

## 2021-03-12 DIAGNOSIS — A09 Infectious gastroenteritis and colitis, unspecified: Secondary | ICD-10-CM | POA: Diagnosis not present

## 2021-03-12 DIAGNOSIS — Z79899 Other long term (current) drug therapy: Secondary | ICD-10-CM | POA: Diagnosis not present

## 2021-03-12 DIAGNOSIS — U071 COVID-19: Secondary | ICD-10-CM | POA: Diagnosis present

## 2021-03-12 DIAGNOSIS — R1310 Dysphagia, unspecified: Secondary | ICD-10-CM | POA: Diagnosis not present

## 2021-03-12 DIAGNOSIS — I708 Atherosclerosis of other arteries: Secondary | ICD-10-CM | POA: Diagnosis not present

## 2021-03-12 DIAGNOSIS — I129 Hypertensive chronic kidney disease with stage 1 through stage 4 chronic kidney disease, or unspecified chronic kidney disease: Secondary | ICD-10-CM | POA: Diagnosis not present

## 2021-03-12 DIAGNOSIS — N281 Cyst of kidney, acquired: Secondary | ICD-10-CM | POA: Diagnosis present

## 2021-03-12 DIAGNOSIS — K8020 Calculus of gallbladder without cholecystitis without obstruction: Secondary | ICD-10-CM | POA: Diagnosis not present

## 2021-03-12 DIAGNOSIS — N3289 Other specified disorders of bladder: Secondary | ICD-10-CM | POA: Diagnosis not present

## 2021-03-12 DIAGNOSIS — R059 Cough, unspecified: Secondary | ICD-10-CM | POA: Diagnosis not present

## 2021-03-12 DIAGNOSIS — R053 Chronic cough: Secondary | ICD-10-CM | POA: Diagnosis not present

## 2021-03-12 DIAGNOSIS — Z8616 Personal history of COVID-19: Secondary | ICD-10-CM | POA: Diagnosis not present

## 2021-03-12 DIAGNOSIS — M6281 Muscle weakness (generalized): Secondary | ICD-10-CM | POA: Diagnosis not present

## 2021-03-12 DIAGNOSIS — N170 Acute kidney failure with tubular necrosis: Secondary | ICD-10-CM | POA: Diagnosis present

## 2021-03-12 DIAGNOSIS — I6523 Occlusion and stenosis of bilateral carotid arteries: Secondary | ICD-10-CM | POA: Diagnosis not present

## 2021-03-12 DIAGNOSIS — R2689 Other abnormalities of gait and mobility: Secondary | ICD-10-CM | POA: Diagnosis not present

## 2021-03-12 DIAGNOSIS — K529 Noninfective gastroenteritis and colitis, unspecified: Secondary | ICD-10-CM | POA: Diagnosis not present

## 2021-03-12 DIAGNOSIS — N183 Chronic kidney disease, stage 3 unspecified: Secondary | ICD-10-CM | POA: Diagnosis not present

## 2021-03-14 DIAGNOSIS — I4510 Unspecified right bundle-branch block: Secondary | ICD-10-CM

## 2021-03-14 DIAGNOSIS — I4891 Unspecified atrial fibrillation: Secondary | ICD-10-CM

## 2021-03-26 DIAGNOSIS — D649 Anemia, unspecified: Secondary | ICD-10-CM | POA: Diagnosis not present

## 2021-03-26 DIAGNOSIS — Z8616 Personal history of COVID-19: Secondary | ICD-10-CM | POA: Diagnosis not present

## 2021-03-26 DIAGNOSIS — R262 Difficulty in walking, not elsewhere classified: Secondary | ICD-10-CM | POA: Diagnosis not present

## 2021-03-26 DIAGNOSIS — R066 Hiccough: Secondary | ICD-10-CM | POA: Diagnosis not present

## 2021-03-26 DIAGNOSIS — A0811 Acute gastroenteropathy due to Norwalk agent: Secondary | ICD-10-CM | POA: Diagnosis not present

## 2021-03-26 DIAGNOSIS — U071 COVID-19: Secondary | ICD-10-CM | POA: Diagnosis not present

## 2021-03-26 DIAGNOSIS — R1310 Dysphagia, unspecified: Secondary | ICD-10-CM | POA: Diagnosis not present

## 2021-03-26 DIAGNOSIS — I10 Essential (primary) hypertension: Secondary | ICD-10-CM | POA: Diagnosis not present

## 2021-03-26 DIAGNOSIS — N1831 Chronic kidney disease, stage 3a: Secondary | ICD-10-CM | POA: Diagnosis not present

## 2021-03-26 DIAGNOSIS — A09 Infectious gastroenteritis and colitis, unspecified: Secondary | ICD-10-CM | POA: Diagnosis not present

## 2021-03-26 DIAGNOSIS — N170 Acute kidney failure with tubular necrosis: Secondary | ICD-10-CM | POA: Diagnosis not present

## 2021-03-26 DIAGNOSIS — R2689 Other abnormalities of gait and mobility: Secondary | ICD-10-CM | POA: Diagnosis not present

## 2021-03-26 DIAGNOSIS — G20 Parkinson's disease: Secondary | ICD-10-CM | POA: Diagnosis not present

## 2021-03-26 DIAGNOSIS — M6281 Muscle weakness (generalized): Secondary | ICD-10-CM | POA: Diagnosis not present

## 2021-03-26 DIAGNOSIS — L988 Other specified disorders of the skin and subcutaneous tissue: Secondary | ICD-10-CM | POA: Diagnosis not present

## 2021-03-30 DIAGNOSIS — G20 Parkinson's disease: Secondary | ICD-10-CM | POA: Diagnosis not present

## 2021-03-30 DIAGNOSIS — R262 Difficulty in walking, not elsewhere classified: Secondary | ICD-10-CM | POA: Diagnosis not present

## 2021-03-30 DIAGNOSIS — D649 Anemia, unspecified: Secondary | ICD-10-CM | POA: Diagnosis not present

## 2021-03-30 DIAGNOSIS — N1831 Chronic kidney disease, stage 3a: Secondary | ICD-10-CM | POA: Diagnosis not present

## 2021-03-31 DIAGNOSIS — L988 Other specified disorders of the skin and subcutaneous tissue: Secondary | ICD-10-CM | POA: Diagnosis not present

## 2021-04-07 DIAGNOSIS — L988 Other specified disorders of the skin and subcutaneous tissue: Secondary | ICD-10-CM | POA: Diagnosis not present

## 2021-04-14 ENCOUNTER — Ambulatory Visit: Payer: Medicare Other | Admitting: Neurology

## 2021-04-14 DIAGNOSIS — L988 Other specified disorders of the skin and subcutaneous tissue: Secondary | ICD-10-CM | POA: Diagnosis not present

## 2021-04-21 DIAGNOSIS — L988 Other specified disorders of the skin and subcutaneous tissue: Secondary | ICD-10-CM | POA: Diagnosis not present

## 2021-04-30 DIAGNOSIS — E441 Mild protein-calorie malnutrition: Secondary | ICD-10-CM | POA: Diagnosis not present

## 2021-04-30 DIAGNOSIS — G20 Parkinson's disease: Secondary | ICD-10-CM | POA: Diagnosis not present

## 2021-04-30 DIAGNOSIS — M2550 Pain in unspecified joint: Secondary | ICD-10-CM | POA: Diagnosis not present

## 2021-04-30 DIAGNOSIS — K219 Gastro-esophageal reflux disease without esophagitis: Secondary | ICD-10-CM | POA: Diagnosis not present

## 2021-05-12 DIAGNOSIS — E559 Vitamin D deficiency, unspecified: Secondary | ICD-10-CM | POA: Diagnosis not present

## 2021-05-12 DIAGNOSIS — D649 Anemia, unspecified: Secondary | ICD-10-CM | POA: Diagnosis not present

## 2021-05-12 DIAGNOSIS — I509 Heart failure, unspecified: Secondary | ICD-10-CM | POA: Diagnosis not present

## 2021-05-12 DIAGNOSIS — E538 Deficiency of other specified B group vitamins: Secondary | ICD-10-CM | POA: Diagnosis not present

## 2021-05-23 DIAGNOSIS — G20 Parkinson's disease: Secondary | ICD-10-CM | POA: Diagnosis not present

## 2021-05-23 DIAGNOSIS — N1831 Chronic kidney disease, stage 3a: Secondary | ICD-10-CM | POA: Diagnosis not present

## 2021-05-23 DIAGNOSIS — K219 Gastro-esophageal reflux disease without esophagitis: Secondary | ICD-10-CM | POA: Diagnosis not present

## 2021-05-23 DIAGNOSIS — E785 Hyperlipidemia, unspecified: Secondary | ICD-10-CM | POA: Diagnosis not present

## 2021-06-11 DIAGNOSIS — F32A Depression, unspecified: Secondary | ICD-10-CM | POA: Diagnosis not present

## 2021-06-18 DIAGNOSIS — E039 Hypothyroidism, unspecified: Secondary | ICD-10-CM | POA: Diagnosis not present

## 2021-06-22 DIAGNOSIS — I119 Hypertensive heart disease without heart failure: Secondary | ICD-10-CM | POA: Diagnosis not present

## 2021-06-22 DIAGNOSIS — R5381 Other malaise: Secondary | ICD-10-CM | POA: Diagnosis not present

## 2021-06-22 DIAGNOSIS — E441 Mild protein-calorie malnutrition: Secondary | ICD-10-CM | POA: Diagnosis not present

## 2021-06-22 DIAGNOSIS — F339 Major depressive disorder, recurrent, unspecified: Secondary | ICD-10-CM | POA: Diagnosis not present

## 2021-06-25 DIAGNOSIS — N390 Urinary tract infection, site not specified: Secondary | ICD-10-CM | POA: Diagnosis not present

## 2021-07-25 DEATH — deceased
